# Patient Record
Sex: Female | Born: 1945 | Race: White | Hispanic: No | Marital: Single | Smoking: Never smoker
Health system: Southern US, Community
[De-identification: ages and names within clinical notes are randomized; demographics above are authoritative.]

## PROBLEM LIST (undated history)

## (undated) DIAGNOSIS — E041 Nontoxic single thyroid nodule: Secondary | ICD-10-CM

## (undated) DIAGNOSIS — K5792 Diverticulitis of intestine, part unspecified, without perforation or abscess without bleeding: Secondary | ICD-10-CM

## (undated) DIAGNOSIS — F419 Anxiety disorder, unspecified: Secondary | ICD-10-CM

## (undated) DIAGNOSIS — F32A Depression, unspecified: Secondary | ICD-10-CM

## (undated) DIAGNOSIS — I10 Essential (primary) hypertension: Secondary | ICD-10-CM

## (undated) DIAGNOSIS — E785 Hyperlipidemia, unspecified: Secondary | ICD-10-CM

## (undated) DIAGNOSIS — F329 Major depressive disorder, single episode, unspecified: Secondary | ICD-10-CM

## (undated) HISTORY — DX: Anxiety disorder, unspecified: F41.9

## (undated) HISTORY — DX: Hyperlipidemia, unspecified: E78.5

## (undated) HISTORY — PX: CHOLECYSTECTOMY: SHX55

## (undated) HISTORY — DX: Diverticulitis of intestine, part unspecified, without perforation or abscess without bleeding: K57.92

## (undated) HISTORY — PX: ABDOMINAL HYSTERECTOMY: SHX81

## (undated) HISTORY — PX: OTHER SURGICAL HISTORY: SHX169

## (undated) HISTORY — DX: Nontoxic single thyroid nodule: E04.1

## (undated) HISTORY — DX: Depression, unspecified: F32.A

---
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
## 1993-05-24 HISTORY — PX: BUNIONECTOMY: SHX129

## 1994-05-24 HISTORY — PX: ABDOMINAL HYSTERECTOMY: SHX81

## 1998-03-12 ENCOUNTER — Ambulatory Visit (HOSPITAL_COMMUNITY): Admission: RE | Admit: 1998-03-12 | Discharge: 1998-03-12 | Payer: Self-pay | Admitting: Gastroenterology

## 2000-06-21 ENCOUNTER — Ambulatory Visit (HOSPITAL_COMMUNITY): Admission: RE | Admit: 2000-06-21 | Discharge: 2000-06-21 | Payer: Self-pay | Admitting: Gastroenterology

## 2001-01-17 ENCOUNTER — Encounter: Payer: Self-pay | Admitting: Family Medicine

## 2001-01-17 ENCOUNTER — Encounter: Admission: RE | Admit: 2001-01-17 | Discharge: 2001-01-17 | Payer: Self-pay | Admitting: Family Medicine

## 2001-01-25 ENCOUNTER — Ambulatory Visit (HOSPITAL_COMMUNITY): Admission: RE | Admit: 2001-01-25 | Discharge: 2001-01-25 | Payer: Self-pay | Admitting: Family Medicine

## 2001-01-26 ENCOUNTER — Encounter: Payer: Self-pay | Admitting: Family Medicine

## 2001-02-02 ENCOUNTER — Ambulatory Visit (HOSPITAL_COMMUNITY): Admission: RE | Admit: 2001-02-02 | Discharge: 2001-02-02 | Payer: Self-pay | Admitting: Family Medicine

## 2001-02-02 ENCOUNTER — Encounter: Payer: Self-pay | Admitting: Family Medicine

## 2001-04-19 ENCOUNTER — Encounter: Payer: Self-pay | Admitting: Surgery

## 2001-04-19 ENCOUNTER — Inpatient Hospital Stay (HOSPITAL_COMMUNITY): Admission: EM | Admit: 2001-04-19 | Discharge: 2001-04-20 | Payer: Self-pay | Admitting: Emergency Medicine

## 2002-10-09 ENCOUNTER — Ambulatory Visit (HOSPITAL_COMMUNITY): Admission: RE | Admit: 2002-10-09 | Discharge: 2002-10-09 | Payer: Self-pay | Admitting: Neurology

## 2004-05-11 ENCOUNTER — Encounter: Admission: RE | Admit: 2004-05-11 | Discharge: 2004-05-11 | Payer: Self-pay | Admitting: Family Medicine

## 2005-04-27 ENCOUNTER — Other Ambulatory Visit: Admission: RE | Admit: 2005-04-27 | Discharge: 2005-04-27 | Payer: Self-pay | Admitting: *Deleted

## 2005-05-14 ENCOUNTER — Encounter: Admission: RE | Admit: 2005-05-14 | Discharge: 2005-05-14 | Payer: Self-pay | Admitting: Family Medicine

## 2006-03-10 ENCOUNTER — Encounter: Admission: RE | Admit: 2006-03-10 | Discharge: 2006-04-11 | Payer: Self-pay | Admitting: Family Medicine

## 2006-09-02 ENCOUNTER — Emergency Department (HOSPITAL_COMMUNITY): Admission: EM | Admit: 2006-09-02 | Discharge: 2006-09-02 | Payer: Self-pay | Admitting: Emergency Medicine

## 2010-06-13 ENCOUNTER — Encounter: Payer: Self-pay | Admitting: Family Medicine

## 2010-06-14 ENCOUNTER — Encounter: Payer: Self-pay | Admitting: Family Medicine

## 2010-06-22 ENCOUNTER — Ambulatory Visit (HOSPITAL_COMMUNITY)
Admission: RE | Admit: 2010-06-22 | Discharge: 2010-06-22 | Payer: Self-pay | Source: Home / Self Care | Attending: Family Medicine | Admitting: Family Medicine

## 2013-11-21 HISTORY — PX: CATARACT EXTRACTION: SUR2

## 2014-04-22 ENCOUNTER — Ambulatory Visit
Admission: RE | Admit: 2014-04-22 | Discharge: 2014-04-22 | Disposition: A | Discharge status: Home / Self Care | Payer: Medicare Other | Source: Ambulatory Visit | Attending: Family Medicine | Admitting: Family Medicine

## 2014-04-22 ENCOUNTER — Other Ambulatory Visit: Payer: Self-pay | Admitting: Family Medicine

## 2014-04-22 DIAGNOSIS — M545 Low back pain, unspecified: Secondary | ICD-10-CM

## 2016-08-25 ENCOUNTER — Emergency Department (HOSPITAL_BASED_OUTPATIENT_CLINIC_OR_DEPARTMENT_OTHER): Payer: Medicare Other

## 2016-08-25 ENCOUNTER — Encounter (HOSPITAL_BASED_OUTPATIENT_CLINIC_OR_DEPARTMENT_OTHER): Payer: Self-pay | Admitting: Emergency Medicine

## 2016-08-25 ENCOUNTER — Emergency Department (HOSPITAL_BASED_OUTPATIENT_CLINIC_OR_DEPARTMENT_OTHER)
Admission: EM | Admit: 2016-08-25 | Discharge: 2016-08-25 | Disposition: A | Discharge status: Home / Self Care | Payer: Medicare Other | Attending: Emergency Medicine | Admitting: Emergency Medicine

## 2016-08-25 DIAGNOSIS — R1011 Right upper quadrant pain: Secondary | ICD-10-CM | POA: Diagnosis not present

## 2016-08-25 DIAGNOSIS — Z79899 Other long term (current) drug therapy: Secondary | ICD-10-CM | POA: Insufficient documentation

## 2016-08-25 DIAGNOSIS — I10 Essential (primary) hypertension: Secondary | ICD-10-CM | POA: Insufficient documentation

## 2016-08-25 HISTORY — DX: Essential (primary) hypertension: I10

## 2016-08-25 LAB — URINALYSIS, ROUTINE W REFLEX MICROSCOPIC
Bilirubin Urine: NEGATIVE
Glucose, UA: NEGATIVE mg/dL
HGB URINE DIPSTICK: NEGATIVE
KETONES UR: NEGATIVE mg/dL
Nitrite: NEGATIVE
PROTEIN: NEGATIVE mg/dL
SPECIFIC GRAVITY, URINE: 1.017 (ref 1.005–1.030)
pH: 6.5 (ref 5.0–8.0)

## 2016-08-25 LAB — COMPREHENSIVE METABOLIC PANEL
ALT: 16 U/L (ref 14–54)
AST: 21 U/L (ref 15–41)
Albumin: 4 g/dL (ref 3.5–5.0)
Alkaline Phosphatase: 67 U/L (ref 38–126)
Anion gap: 10 (ref 5–15)
BUN: 15 mg/dL (ref 6–20)
CO2: 26 mmol/L (ref 22–32)
Calcium: 9.1 mg/dL (ref 8.9–10.3)
Chloride: 98 mmol/L — ABNORMAL LOW (ref 101–111)
Creatinine, Ser: 0.69 mg/dL (ref 0.44–1.00)
GFR calc Af Amer: >60 mL/min (ref >60)
GFR calc non Af Amer: >60 mL/min (ref >60)
Glucose, Bld: 116 mg/dL — ABNORMAL HIGH (ref 65–99)
Potassium: 3.4 mmol/L — ABNORMAL LOW (ref 3.5–5.1)
Sodium: 134 mmol/L — ABNORMAL LOW (ref 135–145)
Total Bilirubin: 0.6 mg/dL (ref 0.3–1.2)
Total Protein: 7.1 g/dL (ref 6.5–8.1)

## 2016-08-25 LAB — CBC WITH DIFFERENTIAL/PLATELET
Basophils Absolute: 0 10*3/uL (ref 0.0–0.1)
Basophils Relative: 0 %
Eosinophils Absolute: 0.1 10*3/uL (ref 0.0–0.7)
Eosinophils Relative: 1 %
HCT: 34.6 % — ABNORMAL LOW (ref 36.0–46.0)
Hemoglobin: 12.1 g/dL (ref 12.0–15.0)
Lymphocytes Relative: 20 %
Lymphs Abs: 2.2 10*3/uL (ref 0.7–4.0)
MCH: 29.4 pg (ref 26.0–34.0)
MCHC: 35 g/dL (ref 30.0–36.0)
MCV: 84 fL (ref 78.0–100.0)
Monocytes Absolute: 0.8 10*3/uL (ref 0.1–1.0)
Monocytes Relative: 7 %
Neutro Abs: 7.9 10*3/uL — ABNORMAL HIGH (ref 1.7–7.7)
Neutrophils Relative %: 72 %
Platelets: 246 10*3/uL (ref 150–400)
RBC: 4.12 MIL/uL (ref 3.87–5.11)
RDW: 14.1 % (ref 11.5–15.5)
WBC: 11 10*3/uL — ABNORMAL HIGH (ref 4.0–10.5)

## 2016-08-25 LAB — URINALYSIS, MICROSCOPIC (REFLEX): RBC / HPF: NONE SEEN RBC/hpf (ref 0–5)

## 2016-08-25 LAB — I-STAT CG4 LACTIC ACID, ED: Lactic Acid, Venous: 1.29 mmol/L (ref 0.5–1.9)

## 2016-08-25 LAB — LIPASE, BLOOD: Lipase: 19 U/L (ref 11–51)

## 2016-08-25 MED ORDER — HYDROCODONE-ACETAMINOPHEN 5-325 MG PO TABS: 1.0000 | ORAL_TABLET | Freq: Four times a day (QID) | ORAL | 0 refills | Status: DC | PRN

## 2016-08-25 MED ORDER — SODIUM CHLORIDE 0.9 % IV BOLUS (SEPSIS)
1000.0000 mL | Freq: Once | INTRAVENOUS | Status: AC
  Administered 2016-08-25: 1000 mL via INTRAVENOUS

## 2016-08-25 MED ORDER — IOPAMIDOL (ISOVUE-300) INJECTION 61%
100.0000 mL | Freq: Once | INTRAVENOUS | Status: AC | PRN
  Administered 2016-08-25: 100 mL via INTRAVENOUS

## 2016-08-25 NOTE — ED Provider Notes (Signed)
[facility] Provider Note   CSN: [id number] Arrival date & time: 08/25/16  2025  By signing my name below, I, Audrey Sanchez, attest that this documentation has been prepared under the direction and in the presence of [employer], PA-C.  Electronically Signed: Octavia Sanchez, ED Scribe. 08/25/16. 8:48 PM.    History   Chief Complaint Chief Complaint  Patient presents with  . Abdominal Pain   The history is provided by the patient. No language interpreter was used.   HPI Comments: Audrey Sanchez is a 71 y.o. female who has a PMhx of HTN presents to the Emergency Department complaining of acute onset, right sided abdominal pain since this morning. Pt expresses increased pain when taking a deep breath. She notes feeling very anxious. Pt says she had nausea earlier but it has since resolved. Pt reports having pain with ambulation. She does not have a PMhx of CAD, DVT in legs/lungs or cancer. Pt has an abdominal surgical hx of cesarean section and cholecystectomy. She denies chest pain, fever, chills, and vomiting.  Past Medical History:  Diagnosis Date  . Hypertension     There are no active problems to display for this patient.   Past Surgical History:  Procedure Laterality Date  . ABDOMINAL HYSTERECTOMY    . CHOLECYSTECTOMY      OB History    No data available       Home Medications    Prior to Admission medications   Medication Sig Start Date End Date Taking? Authorizing Provider  valsartan-hydrochlorothiazide (DIOVAN-HCT) 320-25 MG tablet Take 1 tablet by mouth daily.   Yes Historical Provider, MD    Family History No family history on file.  Social History Social History  Substance Use Topics  . Smoking status: Never Smoker  . Smokeless tobacco: Never Used  . Alcohol use No     Allergies   Patient has no known allergies.   Review of Systems Review of Systems  A complete 10 system review of systems was obtained and all systems are  negative except as noted in the HPI and PMH.   Physical Exam Updated Vital Signs BP (!) 161/106 Comment: pt not taken BP meds tonight  Pulse 78   Temp 98.2 F (36.8 C) (Oral)   Resp 18   Ht  (1.549 m)   Wt 153 lb (69.4 kg)   SpO2 96%   BMI 28.91 kg/m   Physical Exam  Constitutional: She is oriented to person, place, and time. She appears well-developed and well-nourished. No distress.  HENT:  Head: Normocephalic and atraumatic.  Mouth/Throat: Oropharynx is clear and moist.  Eyes: EOM are normal. Pupils are equal, round, and reactive to light.  Neck: Normal range of motion. Neck supple.  Cardiovascular: Normal rate, regular rhythm and normal heart sounds.  Exam reveals no gallop and no friction rub.   No murmur heard. Pulmonary/Chest: Effort normal and breath sounds normal. No respiratory distress. She has no wheezes.  Abdominal: Soft. Bowel sounds are normal. She exhibits no distension and no mass. There is tenderness. There is guarding. There is no rebound.  Guarding throughout right side of abdomen  Musculoskeletal: Normal range of motion.  Neurological: She is alert and oriented to person, place, and time. She exhibits normal muscle tone. Coordination normal.  Skin: Skin is warm and dry. Capillary refill takes less than 2 seconds. No rash noted. No erythema.  Psychiatric: She has a normal mood and affect. Her behavior is normal.  Nursing  note and vitals reviewed.    ED Treatments / Results  DIAGNOSTIC STUDIES: Oxygen Saturation is 96% on RA, normal by my interpretation.  COORDINATION OF CARE:  8:46 PM Discussed treatment plan with pt at bedside and pt agreed to plan.  Labs (all labs ordered are listed, but only abnormal results are displayed) Labs Reviewed  URINALYSIS, ROUTINE W REFLEX MICROSCOPIC    EKG  EKG Interpretation None       Radiology No results found.  Procedures Procedures (including critical care time)  Medications Ordered in  ED Medications - No data to display   Initial Impression / Assessment and Plan / ED Course  I have reviewed the triage vital signs and the nursing notes.  Pertinent labs & imaging results that were available during my care of the patient were reviewed by me and considered in my medical decision making (see chart for details).     Dr. Ranae Palms saw the patient as well.  He feels that this is most likely musculoskeletal.  The patient was feeling some better, still having pain.  On reexamination, the patient is advised return here for any worsening in her condition.  CT scan did not show any significant abnormalities at this time.  Patient is advised plan and all questions were answered  Final Clinical Impressions(s) / ED Diagnoses   Final diagnoses:  None   I personally performed the services described in this documentation, which was scribed in my presence. The recorded information has been reviewed and is accurate. New Prescriptions New Prescriptions   No medications on file     Charlestine Night, PA-C 08/27/16 1610    Loren Racer, MD 08/28/16 1725

## 2016-08-25 NOTE — ED Notes (Signed)
Physician at bedside.

## 2016-08-25 NOTE — Discharge Instructions (Signed)
Follow-up with your GI doctor.  Return here as needed for any worsening in your condition

## 2016-08-25 NOTE — ED Triage Notes (Signed)
abd pain since this morning, denies vomiting, slight nausea this morning but went away

## 2017-06-22 DIAGNOSIS — N390 Urinary tract infection, site not specified: Secondary | ICD-10-CM | POA: Diagnosis not present

## 2017-06-22 DIAGNOSIS — R319 Hematuria, unspecified: Secondary | ICD-10-CM | POA: Diagnosis not present

## 2017-09-19 DIAGNOSIS — H5212 Myopia, left eye: Secondary | ICD-10-CM | POA: Diagnosis not present

## 2017-10-14 DIAGNOSIS — R7303 Prediabetes: Secondary | ICD-10-CM | POA: Diagnosis not present

## 2017-10-14 DIAGNOSIS — E7800 Pure hypercholesterolemia, unspecified: Secondary | ICD-10-CM | POA: Diagnosis not present

## 2017-10-14 DIAGNOSIS — R5383 Other fatigue: Secondary | ICD-10-CM | POA: Diagnosis not present

## 2017-10-14 DIAGNOSIS — I10 Essential (primary) hypertension: Secondary | ICD-10-CM | POA: Diagnosis not present

## 2017-10-14 DIAGNOSIS — E041 Nontoxic single thyroid nodule: Secondary | ICD-10-CM | POA: Diagnosis not present

## 2017-10-14 DIAGNOSIS — F339 Major depressive disorder, recurrent, unspecified: Secondary | ICD-10-CM | POA: Diagnosis not present

## 2017-10-14 DIAGNOSIS — F411 Generalized anxiety disorder: Secondary | ICD-10-CM | POA: Diagnosis not present

## 2017-10-19 ENCOUNTER — Other Ambulatory Visit: Payer: Self-pay | Admitting: Family Medicine

## 2017-10-19 DIAGNOSIS — E041 Nontoxic single thyroid nodule: Secondary | ICD-10-CM

## 2017-10-31 DIAGNOSIS — R300 Dysuria: Secondary | ICD-10-CM | POA: Diagnosis not present

## 2017-10-31 DIAGNOSIS — N390 Urinary tract infection, site not specified: Secondary | ICD-10-CM | POA: Diagnosis not present

## 2017-11-02 DIAGNOSIS — H5213 Myopia, bilateral: Secondary | ICD-10-CM | POA: Diagnosis not present

## 2017-11-02 DIAGNOSIS — H524 Presbyopia: Secondary | ICD-10-CM | POA: Diagnosis not present

## 2017-11-02 DIAGNOSIS — H52209 Unspecified astigmatism, unspecified eye: Secondary | ICD-10-CM | POA: Diagnosis not present

## 2017-11-08 DIAGNOSIS — L237 Allergic contact dermatitis due to plants, except food: Secondary | ICD-10-CM | POA: Diagnosis not present

## 2018-03-25 DIAGNOSIS — Z1231 Encounter for screening mammogram for malignant neoplasm of breast: Secondary | ICD-10-CM | POA: Diagnosis not present

## 2018-06-26 DIAGNOSIS — I10 Essential (primary) hypertension: Secondary | ICD-10-CM | POA: Diagnosis not present

## 2018-06-26 DIAGNOSIS — E7800 Pure hypercholesterolemia, unspecified: Secondary | ICD-10-CM | POA: Diagnosis not present

## 2018-06-26 DIAGNOSIS — F339 Major depressive disorder, recurrent, unspecified: Secondary | ICD-10-CM | POA: Diagnosis not present

## 2018-06-26 DIAGNOSIS — G479 Sleep disorder, unspecified: Secondary | ICD-10-CM | POA: Diagnosis not present

## 2018-06-26 DIAGNOSIS — R7303 Prediabetes: Secondary | ICD-10-CM | POA: Diagnosis not present

## 2018-06-26 DIAGNOSIS — R7309 Other abnormal glucose: Secondary | ICD-10-CM | POA: Diagnosis not present

## 2018-06-26 DIAGNOSIS — Z8639 Personal history of other endocrine, nutritional and metabolic disease: Secondary | ICD-10-CM | POA: Diagnosis not present

## 2018-06-26 DIAGNOSIS — F411 Generalized anxiety disorder: Secondary | ICD-10-CM | POA: Diagnosis not present

## 2018-07-05 DIAGNOSIS — J209 Acute bronchitis, unspecified: Secondary | ICD-10-CM | POA: Diagnosis not present

## 2018-07-24 DIAGNOSIS — K111 Hypertrophy of salivary gland: Secondary | ICD-10-CM | POA: Diagnosis not present

## 2018-07-26 DIAGNOSIS — K115 Sialolithiasis: Secondary | ICD-10-CM | POA: Diagnosis not present

## 2018-11-07 DIAGNOSIS — H903 Sensorineural hearing loss, bilateral: Secondary | ICD-10-CM | POA: Diagnosis not present

## 2018-12-11 DIAGNOSIS — H524 Presbyopia: Secondary | ICD-10-CM | POA: Diagnosis not present

## 2019-02-16 DIAGNOSIS — Z20828 Contact with and (suspected) exposure to other viral communicable diseases: Secondary | ICD-10-CM | POA: Diagnosis not present

## 2019-03-05 DIAGNOSIS — Z8639 Personal history of other endocrine, nutritional and metabolic disease: Secondary | ICD-10-CM | POA: Diagnosis not present

## 2019-03-05 DIAGNOSIS — R7303 Prediabetes: Secondary | ICD-10-CM | POA: Diagnosis not present

## 2019-03-05 DIAGNOSIS — G479 Sleep disorder, unspecified: Secondary | ICD-10-CM | POA: Diagnosis not present

## 2019-03-05 DIAGNOSIS — F411 Generalized anxiety disorder: Secondary | ICD-10-CM | POA: Diagnosis not present

## 2019-03-05 DIAGNOSIS — F339 Major depressive disorder, recurrent, unspecified: Secondary | ICD-10-CM | POA: Diagnosis not present

## 2019-03-05 DIAGNOSIS — I10 Essential (primary) hypertension: Secondary | ICD-10-CM | POA: Diagnosis not present

## 2019-03-05 DIAGNOSIS — E7800 Pure hypercholesterolemia, unspecified: Secondary | ICD-10-CM | POA: Diagnosis not present

## 2019-03-23 DIAGNOSIS — Z0000 Encounter for general adult medical examination without abnormal findings: Secondary | ICD-10-CM | POA: Diagnosis not present

## 2019-03-23 DIAGNOSIS — F411 Generalized anxiety disorder: Secondary | ICD-10-CM | POA: Diagnosis not present

## 2019-03-23 DIAGNOSIS — R7303 Prediabetes: Secondary | ICD-10-CM | POA: Diagnosis not present

## 2019-03-23 DIAGNOSIS — I10 Essential (primary) hypertension: Secondary | ICD-10-CM | POA: Diagnosis not present

## 2019-03-23 DIAGNOSIS — E7800 Pure hypercholesterolemia, unspecified: Secondary | ICD-10-CM | POA: Diagnosis not present

## 2019-03-23 DIAGNOSIS — G479 Sleep disorder, unspecified: Secondary | ICD-10-CM | POA: Diagnosis not present

## 2019-03-23 DIAGNOSIS — F339 Major depressive disorder, recurrent, unspecified: Secondary | ICD-10-CM | POA: Diagnosis not present

## 2019-04-17 DIAGNOSIS — Z1231 Encounter for screening mammogram for malignant neoplasm of breast: Secondary | ICD-10-CM | POA: Diagnosis not present

## 2019-04-25 ENCOUNTER — Encounter (HOSPITAL_BASED_OUTPATIENT_CLINIC_OR_DEPARTMENT_OTHER): Payer: Self-pay

## 2019-04-25 ENCOUNTER — Emergency Department (HOSPITAL_BASED_OUTPATIENT_CLINIC_OR_DEPARTMENT_OTHER): Payer: Medicare HMO

## 2019-04-25 ENCOUNTER — Other Ambulatory Visit (HOSPITAL_BASED_OUTPATIENT_CLINIC_OR_DEPARTMENT_OTHER): Payer: Self-pay | Admitting: Family Medicine

## 2019-04-25 ENCOUNTER — Ambulatory Visit (HOSPITAL_BASED_OUTPATIENT_CLINIC_OR_DEPARTMENT_OTHER): Admission: RE | Admit: 2019-04-25 | Payer: Medicare HMO | Source: Ambulatory Visit

## 2019-04-25 ENCOUNTER — Other Ambulatory Visit: Payer: Self-pay

## 2019-04-25 ENCOUNTER — Emergency Department (HOSPITAL_BASED_OUTPATIENT_CLINIC_OR_DEPARTMENT_OTHER)
Admission: EM | Admit: 2019-04-25 | Discharge: 2019-04-25 | Disposition: A | Discharge status: Home / Self Care | Payer: Medicare HMO | Attending: Emergency Medicine | Admitting: Emergency Medicine

## 2019-04-25 DIAGNOSIS — M79605 Pain in left leg: Secondary | ICD-10-CM | POA: Diagnosis not present

## 2019-04-25 DIAGNOSIS — Z79899 Other long term (current) drug therapy: Secondary | ICD-10-CM | POA: Diagnosis not present

## 2019-04-25 DIAGNOSIS — M79652 Pain in left thigh: Secondary | ICD-10-CM | POA: Diagnosis not present

## 2019-04-25 DIAGNOSIS — M79662 Pain in left lower leg: Secondary | ICD-10-CM | POA: Diagnosis not present

## 2019-04-25 DIAGNOSIS — I10 Essential (primary) hypertension: Secondary | ICD-10-CM | POA: Insufficient documentation

## 2019-04-25 LAB — CBC WITH DIFFERENTIAL/PLATELET
Abs Immature Granulocytes: 0.03 10*3/uL (ref 0.00–0.07)
Basophils Absolute: 0 10*3/uL (ref 0.0–0.1)
Basophils Relative: 0 %
Eosinophils Absolute: 0 10*3/uL (ref 0.0–0.5)
Eosinophils Relative: 0 %
HCT: 38.2 % (ref 36.0–46.0)
Hemoglobin: 13.1 g/dL (ref 12.0–15.0)
Immature Granulocytes: 0 %
Lymphocytes Relative: 11 %
Lymphs Abs: 1.1 10*3/uL (ref 0.7–4.0)
MCH: 29 pg (ref 26.0–34.0)
MCHC: 34.3 g/dL (ref 30.0–36.0)
MCV: 84.5 fL (ref 80.0–100.0)
Monocytes Absolute: 0.3 10*3/uL (ref 0.1–1.0)
Monocytes Relative: 3 %
Neutro Abs: 8.7 10*3/uL — ABNORMAL HIGH (ref 1.7–7.7)
Neutrophils Relative %: 86 %
Platelets: 284 10*3/uL (ref 150–400)
RBC: 4.52 MIL/uL (ref 3.87–5.11)
RDW: 13.5 % (ref 11.5–15.5)
WBC: 10.2 10*3/uL (ref 4.0–10.5)
nRBC: 0 % (ref 0.0–0.2)

## 2019-04-25 LAB — COMPREHENSIVE METABOLIC PANEL WITH GFR
ALT: 27 U/L (ref 0–44)
AST: 36 U/L (ref 15–41)
Albumin: 4.3 g/dL (ref 3.5–5.0)
Alkaline Phosphatase: 74 U/L (ref 38–126)
Anion gap: 12 (ref 5–15)
BUN: 14 mg/dL (ref 8–23)
CO2: 23 mmol/L (ref 22–32)
Calcium: 9.4 mg/dL (ref 8.9–10.3)
Chloride: 95 mmol/L — ABNORMAL LOW (ref 98–111)
Creatinine, Ser: 0.61 mg/dL (ref 0.44–1.00)
GFR calc Af Amer: >60 mL/min
GFR calc non Af Amer: >60 mL/min
Glucose, Bld: 124 mg/dL — ABNORMAL HIGH (ref 70–99)
Potassium: 3.2 mmol/L — ABNORMAL LOW (ref 3.5–5.1)
Sodium: 130 mmol/L — ABNORMAL LOW (ref 135–145)
Total Bilirubin: 0.9 mg/dL (ref 0.3–1.2)
Total Protein: 8 g/dL (ref 6.5–8.1)

## 2019-04-25 MED ORDER — HYDROCODONE-ACETAMINOPHEN 5-325 MG PO TABS
1.0000 | ORAL_TABLET | Freq: Once | ORAL | Status: AC
  Administered 2019-04-25: 16:00:00 1 via ORAL
  Filled 2019-04-25: qty 1

## 2019-04-25 NOTE — ED Notes (Signed)
Pt on monitor and is aware of [place] needing a urine sample, RN [facility] informed.

## 2019-04-25 NOTE — ED Triage Notes (Addendum)
Pt c/o left LE pain x days-states she was sent from PCP for a 215pm study to r/o blood clot-states she was given pain inj in the ofice that did not help pain-states she wants to be seen in ED for elevated BP and pain-pt anxious/hyperventilating-encouraged to take slow deep breaths-to triage and tx area in w/c

## 2019-04-25 NOTE — ED Notes (Signed)
EDP at bedside discussing test results and dispo plan of care. 

## 2019-04-25 NOTE — ED Provider Notes (Signed)
[facility] EMERGENCY DEPARTMENT Provider Note   CSN: [id number] Arrival date & time: 04/25/19  1321     History   Chief Complaint Chief Complaint  Patient presents with   Leg Pain    HPI Audrey Sanchez is a 73 y.o. female with a past medical history significant for hypertension who presents to the ED from her PCP office for an evaluation of left leg pain.  Patient states left leg pain started gradually on Sunday and has progressively gotten worse. Pain started in her left groin area and radiates down anterior aspect of leg to the left knee and sometimes into the buttock region.  Patient denies injury. Patient describes it as a constant aching feeling.  Patient denies associative edema and erythema.  Patient has tried Tylenol without relief.  Patient denies history of blood clots, recent surgeries, recent immobilizations, history of cancer, and hemoptysis.  Patient denies shortness of breath and chest pain.   Past Medical History:  Diagnosis Date   Hypertension     There are no active problems to display for this patient.   Past Surgical History:  Procedure Laterality Date   ABDOMINAL HYSTERECTOMY     CHOLECYSTECTOMY       OB History   No obstetric history on file.      Home Medications    Prior to Admission medications   Medication Sig Start Date End Date Taking? Authorizing Provider  HYDROcodone-acetaminophen (NORCO/VICODIN) 5-325 MG tablet Take 1 tablet by mouth every 6 (six) hours as needed for moderate pain. 08/25/16   Lawyer, Cristal Deer, PA-C  valsartan-hydrochlorothiazide (DIOVAN-HCT) 320-25 MG tablet Take 1 tablet by mouth daily.    [provider]    Family History No family history on file.  Social History Social History   Tobacco Use   Smoking status: Never Smoker   Smokeless tobacco: Never Used  Substance Use Topics   Alcohol use: No   Drug use: No     Allergies   Patient has no known allergies.   Review of  Systems Review of Systems  Constitutional: Negative for chills and fever.  Respiratory: Negative for cough and shortness of breath.   Cardiovascular: Negative for chest pain.  Musculoskeletal: Positive for myalgias. Negative for back pain.  Skin: Negative for color change.  Neurological: Negative for numbness.  All other systems reviewed and are negative.    Physical Exam Updated Vital Signs BP (!) 202/118 (BP Location: Right Arm)    Pulse 91    Temp 98.6 F (37 C) (Oral)    Resp 16    Ht 5' (1.524 m)    Wt 68.9 kg    SpO2 100%    BMI 29.69 kg/m   Physical Exam Vitals signs and nursing note reviewed.  Constitutional:      General: She is not in acute distress.    Appearance: She is not ill-appearing.  HENT:     Head: Normocephalic.  Eyes:     Conjunctiva/sclera: Conjunctivae normal.  Neck:     Musculoskeletal: Neck supple.  Cardiovascular:     Rate and Rhythm: Normal rate and regular rhythm.     Pulses: Normal pulses.     Heart sounds: Normal heart sounds. No murmur. No friction rub. No gallop.   Pulmonary:     Effort: Pulmonary effort is normal.     Breath sounds: Normal breath sounds.     Comments: Respirations equal and unlabored, patient able to speak in full sentences, lungs clear to  auscultation bilaterally Abdominal:     General: Abdomen is flat. There is no distension.     Palpations: Abdomen is soft.     Tenderness: There is no abdominal tenderness. There is no guarding or rebound.  Musculoskeletal:     Comments: Tenderness to palpation over anterior aspect of left thigh. No edema or erythema. No deformity or crepitus. No calf tenderness. Negative Homans sign. All distal pulses and sensation intact.  Skin:    General: Skin is warm and dry.  Neurological:     General: No focal deficit present.     Mental Status: She is alert.      ED Treatments / Results  Labs (all labs ordered are listed, but only abnormal results are displayed) Labs Reviewed  CBC WITH  DIFFERENTIAL/PLATELET - Abnormal; Notable for the following components:      Result Value   Neutro Abs 8.7 (*)    All other components within normal limits  COMPREHENSIVE METABOLIC PANEL - Abnormal; Notable for the following components:   Sodium 130 (*)    Potassium 3.2 (*)    Chloride 95 (*)    Glucose, Bld 124 (*)    All other components within normal limits    EKG EKG Interpretation  Date/Time:  Wednesday April 25 2019 14:37:35 EST Ventricular Rate:  92 PR Interval:    QRS Duration: 72 QT Interval:  380 QTC Calculation: 471 R Axis:   67 Text Interpretation: Sinus rhythm Borderline T abnormalities, anterior leads Baseline wander in lead(s) V2 V3 Confirmed by Virgina NorfolkAdam, [place] [phone]([id number]) on 04/25/2019 3:31:35 PM   Radiology [place] Venous Img Lower Unilateral Left  Result Date: 04/25/2019 CLINICAL DATA:  Left lower extremity pain for the past 3-4 days. Evaluate for DVT. EXAM: LEFT LOWER EXTREMITY VENOUS DOPPLER ULTRASOUND TECHNIQUE: Gray-scale sonography with graded compression, as well as color Doppler and duplex ultrasound were performed to evaluate the lower extremity deep venous systems from the level of the common femoral vein and including the common femoral, femoral, profunda femoral, popliteal and calf veins including the posterior tibial, peroneal and gastrocnemius veins when visible. The superficial great saphenous vein was also interrogated. Spectral Doppler was utilized to evaluate flow at rest and with distal augmentation maneuvers in the common femoral, femoral and popliteal veins. COMPARISON:  None. FINDINGS: Contralateral Common Femoral Vein: Respiratory phasicity is normal and symmetric with the symptomatic side. No evidence of thrombus. Normal compressibility. Common Femoral Vein: No evidence of thrombus. Normal compressibility, respiratory phasicity and response to augmentation. Saphenofemoral Junction: No evidence of thrombus. Normal compressibility and flow on color Doppler  imaging. Profunda Femoral Vein: No evidence of thrombus. Normal compressibility and flow on color Doppler imaging. Femoral Vein: No evidence of thrombus. Normal compressibility, respiratory phasicity and response to augmentation. Popliteal Vein: No evidence of thrombus. Normal compressibility, respiratory phasicity and response to augmentation. Calf Veins: No evidence of thrombus. Normal compressibility and flow on color Doppler imaging. Superficial Great Saphenous Vein: No evidence of thrombus. Normal compressibility. Venous Reflux:  None. Other Findings: Note is made of a prominent though non pathologically enlarged left inguinal lymph node which is not enlarged by size criteria measuring 0.7 cm in greatest short axis diameter and maintains a benign fatty hilum, presumably reactive in etiology. IMPRESSION: No evidence of DVT within the left lower extremity. Electronically Signed   By: Simonne ComeJohn  Watts M.D.   On: 04/25/2019 15:59    Procedures Procedures (including critical care time)  Medications Ordered in ED Medications  HYDROcodone-acetaminophen (NORCO/VICODIN) 5-325 MG  per tablet 1 tablet (1 tablet Oral Given 04/25/19 1559)     Initial Impression / Assessment and Plan / ED Course  I have reviewed the triage vital signs and the nursing notes.  Pertinent labs & imaging results that were available during my care of the patient were reviewed by me and considered in my medical decision making (see chart for details).       73 year old female presents to the ED due to severe left leg pain. Patient was sent from her PCP to rule out a DVT. Vitals stable, with elevated blood pressure at 202/118. Triage note states patient was anxious and hyperventilating during initial vitals which likely caused the elevation in addition to her pain. Will continue to monitor. Patient in no acute distress and non-toxic appearing. Tenderness to palpation over anterior and lateral aspect of left upper leg. No erythema,  edema, or warmth. Negative Homans sign. Neurovascularly intact. Patient has no risk factors for DVT/PE, but will order [place] to rule out DVT given PCP concern. Given patient's increased BP will order routine labs to check for organ damage to rule out hypertensive emergency.   CMP reassuring with normal renal function. CBC reassuring with no leukocytosis. Labs negative for organ damage. Doubt hypertensive emergency. Patient is also asymptomatic. EKG personally reviewed which demonstrates sinus rhythm with no signs of ischemia. [place] personally reviewed which is negative for DVT, but did display:  prominent though non  pathologically enlarged left inguinal lymph node which is not  enlarged by size criteria measuring 0.7 cm in greatest short axis  diameter and maintains a benign fatty hilum, presumably reactive in  etiology.   Patient unable to give urine sample, but given her normal labs, doubt hypertensive emergency. Patient advised to use warm compresses to left groin area. Patient has been instructed to follow-up with PCP within the next week to follow-up on lymph node and elevated blood pressure. Strict ED precautions discussed with patient. Patient states understanding and agrees to plan. Patient discharged home in no acute distress and stable vitals  Final Clinical Impressions(s) / ED Diagnoses   Final diagnoses:  Left leg pain    ED Discharge Orders    None       Romie Levee 04/25/19 2126    Lennice Sites, DO 04/26/19 [phone]

## 2019-04-25 NOTE — ED Notes (Signed)
EDP made aware that patient c/o pain to left lateral upper thigh.  Noted that she has 4 salonpas to site and stated that it does not help the pain.

## 2019-04-25 NOTE — Discharge Instructions (Addendum)
As discussed, your ultrasound was negative for a blood clot, but showed an enlarged left inguinal lymph node. I recommend using warm compresses in that area frequently. Follow-up with your PCP within the next week to recheck lymph node and to check blood pressure. I would assume your blood pressure is elevated due to severe pain. You may take over the counter ibuprofen or tylenol as needed for pain. Return to the ER for new or worsening symptoms.

## 2019-05-02 DIAGNOSIS — M25552 Pain in left hip: Secondary | ICD-10-CM | POA: Diagnosis not present

## 2019-05-03 DIAGNOSIS — M25552 Pain in left hip: Secondary | ICD-10-CM | POA: Diagnosis not present

## 2019-05-03 DIAGNOSIS — M79605 Pain in left leg: Secondary | ICD-10-CM | POA: Diagnosis not present

## 2019-05-04 ENCOUNTER — Emergency Department (HOSPITAL_COMMUNITY)
Admission: EM | Admit: 2019-05-04 | Discharge: 2019-05-05 | Disposition: A | Discharge status: Home / Self Care | Payer: Medicare HMO | Attending: Emergency Medicine | Admitting: Emergency Medicine

## 2019-05-04 ENCOUNTER — Encounter (HOSPITAL_COMMUNITY): Payer: Self-pay | Admitting: Emergency Medicine

## 2019-05-04 ENCOUNTER — Emergency Department (HOSPITAL_COMMUNITY): Payer: Medicare HMO

## 2019-05-04 ENCOUNTER — Other Ambulatory Visit: Payer: Self-pay

## 2019-05-04 DIAGNOSIS — M79606 Pain in leg, unspecified: Secondary | ICD-10-CM | POA: Diagnosis not present

## 2019-05-04 DIAGNOSIS — M79605 Pain in left leg: Secondary | ICD-10-CM

## 2019-05-04 DIAGNOSIS — I10 Essential (primary) hypertension: Secondary | ICD-10-CM | POA: Insufficient documentation

## 2019-05-04 DIAGNOSIS — M25552 Pain in left hip: Secondary | ICD-10-CM | POA: Diagnosis not present

## 2019-05-04 DIAGNOSIS — M79662 Pain in left lower leg: Secondary | ICD-10-CM | POA: Insufficient documentation

## 2019-05-04 DIAGNOSIS — M79652 Pain in left thigh: Secondary | ICD-10-CM | POA: Insufficient documentation

## 2019-05-04 DIAGNOSIS — R52 Pain, unspecified: Secondary | ICD-10-CM

## 2019-05-04 LAB — CBC WITH DIFFERENTIAL/PLATELET
Abs Immature Granulocytes: 0.03 10*3/uL (ref 0.00–0.07)
Basophils Absolute: 0 10*3/uL (ref 0.0–0.1)
Basophils Relative: 1 %
Eosinophils Absolute: 0.1 10*3/uL (ref 0.0–0.5)
Eosinophils Relative: 2 %
HCT: 37.5 % (ref 36.0–46.0)
Hemoglobin: 12.8 g/dL (ref 12.0–15.0)
Immature Granulocytes: 0 %
Lymphocytes Relative: 36 %
Lymphs Abs: 3.1 10*3/uL (ref 0.7–4.0)
MCH: 29.1 pg (ref 26.0–34.0)
MCHC: 34.1 g/dL (ref 30.0–36.0)
MCV: 85.2 fL (ref 80.0–100.0)
Monocytes Absolute: 0.7 10*3/uL (ref 0.1–1.0)
Monocytes Relative: 8 %
Neutro Abs: 4.7 10*3/uL (ref 1.7–7.7)
Neutrophils Relative %: 53 %
Platelets: 308 10*3/uL (ref 150–400)
RBC: 4.4 MIL/uL (ref 3.87–5.11)
RDW: 13.8 % (ref 11.5–15.5)
WBC: 8.7 10*3/uL (ref 4.0–10.5)
nRBC: 0 % (ref 0.0–0.2)

## 2019-05-04 LAB — BASIC METABOLIC PANEL
Anion gap: 11 (ref 5–15)
BUN: 17 mg/dL (ref 8–23)
CO2: 25 mmol/L (ref 22–32)
Calcium: 9.5 mg/dL (ref 8.9–10.3)
Chloride: 94 mmol/L — ABNORMAL LOW (ref 98–111)
Creatinine, Ser: 0.79 mg/dL (ref 0.44–1.00)
GFR calc Af Amer: >60 mL/min (ref >60)
GFR calc non Af Amer: >60 mL/min (ref >60)
Glucose, Bld: 100 mg/dL — ABNORMAL HIGH (ref 70–99)
Potassium: 3.8 mmol/L (ref 3.5–5.1)
Sodium: 130 mmol/L — ABNORMAL LOW (ref 135–145)

## 2019-05-04 LAB — MAGNESIUM: Magnesium: 1.9 mg/dL (ref 1.7–2.4)

## 2019-05-04 LAB — CK: Total CK: 51 U/L (ref 38–234)

## 2019-05-04 MED ORDER — ONDANSETRON 4 MG PO TBDP
4.0000 mg | ORAL_TABLET | Freq: Once | ORAL | Status: AC
  Administered 2019-05-04: 4 mg via ORAL
  Filled 2019-05-04: qty 1

## 2019-05-04 MED ORDER — CYCLOBENZAPRINE HCL 5 MG PO TABS: 5.0000 mg | ORAL_TABLET | Freq: Three times a day (TID) | ORAL | 0 refills | Status: DC | PRN

## 2019-05-04 MED ORDER — NAPROXEN 500 MG PO TABS: 500.0000 mg | ORAL_TABLET | Freq: Two times a day (BID) | ORAL | 0 refills | Status: DC | PRN

## 2019-05-04 MED ORDER — HYDROMORPHONE HCL 1 MG/ML IJ SOLN
1.0000 mg | Freq: Once | INTRAMUSCULAR | Status: AC
Start: 2019-05-04 — End: 2019-05-04
  Administered 2019-05-04: 1 mg via INTRAVENOUS
  Filled 2019-05-04: qty 1

## 2019-05-04 NOTE — ED Triage Notes (Signed)
Pt reports 1 week and 3 days ago was sent to med center to r/o blood clot due to pain leg pain that starts in left groin and radiates into thigh. Per pt she has also since ortho MD had Xrays and also an MRI of her hip and nothing has shown. Pt is currently taken oxycodone and does not seem to help this pain. Pt is still able to walk on this leg but is painful.

## 2019-05-04 NOTE — ED Provider Notes (Signed)
[facility] EMERGENCY DEPARTMENT Provider Note   CSN: [id number] Arrival date & time: 05/04/19  1616     History Chief Complaint  Patient presents with  . Leg Pain    Audrey Sanchez is a 73 y.o. female.  Please management with 2 weeks of left leg pain.  Patient states pain extends from left hip, left thigh, left knee.  Denies any recent injuries.  Pain has been relatively constant, has been taking Norco with some relief.  Also took some oxycodone with some relief.  States pain 10 out of 10, sharp, stabbing.  Worse in certain positions.  No back pain, no abdominal pain.  No burning with urination, no fevers, no skin changes, no numbness or weakness, no bladder or bowel incontinence.  HPI     Past Medical History:  Diagnosis Date  . Hypertension     There are no problems to display for this patient.   Past Surgical History:  Procedure Laterality Date  . ABDOMINAL HYSTERECTOMY    . CHOLECYSTECTOMY       OB History   No obstetric history on file.     No family history on file.  Social History   Tobacco Use  . Smoking status: Never Smoker  . Smokeless tobacco: Never Used  Substance Use Topics  . Alcohol use: No  . Drug use: No    Home Medications Prior to Admission medications   Medication Sig Start Date End Date Taking? Authorizing Provider  cyclobenzaprine (FLEXERIL) 5 MG tablet Take 1 tablet (5 mg total) by mouth 3 (three) times daily as needed for muscle spasms. 05/04/19   Milagros Loll, MD  HYDROcodone-acetaminophen (NORCO/VICODIN) 5-325 MG tablet Take 1 tablet by mouth every 6 (six) hours as needed for moderate pain. 08/25/16   Lawyer, Cristal Deer, PA-C  naproxen (NAPROSYN) 500 MG tablet Take 1 tablet (500 mg total) by mouth 2 (two) times daily as needed for moderate pain. 05/04/19   Milagros Loll, MD  valsartan-hydrochlorothiazide (DIOVAN-HCT) 320-25 MG tablet Take 1 tablet by mouth daily.    [provider]     Allergies    Patient has no known allergies.  Review of Systems   Review of Systems  Constitutional: Negative for chills and fever.  HENT: Negative for ear pain and sore throat.   Eyes: Negative for pain and visual disturbance.  Respiratory: Negative for cough and shortness of breath.   Cardiovascular: Negative for chest pain and palpitations.  Gastrointestinal: Negative for abdominal pain and vomiting.  Genitourinary: Negative for dysuria and hematuria.  Musculoskeletal: Positive for arthralgias. Negative for back pain.  Skin: Negative for color change and rash.  Neurological: Negative for seizures and syncope.  All other systems reviewed and are negative.   Physical Exam Updated Vital Signs BP 113/70   Pulse 80   Temp 98.3 F (36.8 C) (Oral)   Resp 17   SpO2 98%   Physical Exam Vitals and nursing note reviewed.  Constitutional:      General: She is not in acute distress.    Appearance: She is well-developed.  HENT:     Head: Normocephalic and atraumatic.  Eyes:     Conjunctiva/sclera: Conjunctivae normal.  Cardiovascular:     Rate and Rhythm: Normal rate and regular rhythm.     Heart sounds: No murmur.  Pulmonary:     Effort: Pulmonary effort is normal. No respiratory distress.     Breath sounds: Normal breath sounds.  Abdominal:  Palpations: Abdomen is soft.     Tenderness: There is no abdominal tenderness.  Musculoskeletal:     Cervical back: Neck supple.     Comments: Generalized TTP from hip to proximal knee, no obvious deformity, normal joint ROM  Skin:    General: Skin is warm and dry.  Neurological:     General: No focal deficit present.     Mental Status: She is alert and oriented to person, place, and time.     Comments: 5/5 strength in b/l LE, sensation to light touch intact in b/l LE     ED Results / Procedures / Treatments   Labs (all labs ordered are listed, but only abnormal results are displayed) Labs Reviewed  BASIC METABOLIC PANEL -  Abnormal; Notable for the following components:      Result Value   Sodium 130 (*)    Chloride 94 (*)    Glucose, Bld 100 (*)    All other components within normal limits  CBC WITH DIFFERENTIAL/PLATELET  MAGNESIUM  CK  URINALYSIS, ROUTINE W REFLEX MICROSCOPIC    EKG None  Radiology DG FEMUR MIN 2 VIEWS LEFT  Result Date: 05/04/2019 CLINICAL DATA:  Left leg pain, no known injury, initial encounter EXAM: LEFT FEMUR 2 VIEWS COMPARISON:  None. FINDINGS: Degenerative changes about the hip joint and knee joint are seen. No acute fracture or dislocation is noted. No soft tissue abnormality is seen. IMPRESSION: No acute abnormality noted. Electronically Signed   By: Alcide CleverMark  Lukens M.D.   On: 05/04/2019 23:09    Procedures Procedures (including critical care time)  Medications Ordered in ED Medications  HYDROmorphone (DILAUDID) injection 1 mg (1 mg Intravenous Given 05/04/19 2203)    ED Course  I have reviewed the triage vital signs and the nursing notes.  Pertinent labs & imaging results that were available during my care of the patient were reviewed by me and considered in my medical decision making (see chart for details).  Clinical Course as of May 04 2331  Fri May 04, 2019  2329 Recheck patient, updated on results, pain completely resolved after single dose of Dilaudid, reviewed return precautions, will discharge home   [RD]    Clinical Course User Index [RD] Milagros Lollykstra, Deforrest Bogle S, MD   MDM Rules/Calculators/A&P                    73 year old lady presents to ER with 2 weeks of left leg pain.  On exam patient noted to be well-appearing, normal neurologic exam, no obvious deformity.  She reports prior work-up with her orthopod included x-rays of femur, hip which were negative.  Also states she had an MRI of her hip which was negative.  Reports her orthopod planning to MRI femur and back.  Lower extremity duplex of left leg was negative.  Today, check CK to eval for rhabdo,  myositis.  This was within normal limits, given this finding no physical exam abnormality, very low suspicion for this diagnosis.  Given my exam, the extensive work-up that has been done previously, do not feel any additional work-up needed at this time.  Pain well controlled with single dose of IV narcotics.  She has been previously prescribed multiple narcotic prescriptions, recommended trial of NSAIDs and muscle relaxer.  Recommended close recheck with both her primary and orthopedic doctor.  Reviewed return precautions, will discharge home.    After the discussed management above, the patient was determined to be safe for discharge.  The patient was  in agreement with this plan and all questions regarding their care were answered.  ED return precautions were discussed and the patient will return to the ED with any significant worsening of condition.   Final Clinical Impression(s) / ED Diagnoses Final diagnoses:  Pain  Pain of left lower extremity    Rx / DC Orders ED Discharge Orders         Ordered    cyclobenzaprine (FLEXERIL) 5 MG tablet  3 times daily PRN     05/04/19 2328    naproxen (NAPROSYN) 500 MG tablet  2 times daily PRN     05/04/19 2328           Lucrezia Starch, MD 05/04/19 2334

## 2019-05-04 NOTE — Discharge Instructions (Addendum)
I recommend following up with your primary doctor as well as with your orthopedic doctor.  Recommend keeping your previously scheduled outpatient MRIs.  If you develop numbness, weakness, bladder or bowel incontinence, redness, swelling, fevers or other new concerning symptom, recommend return to ER for recheck.  Recommend taking prescribed muscle relaxer and anti-inflammatory for your pain.  As needed for breakthrough pain can take the previously prescribed narcotic.  Please note that the muscle relaxer and your narcotics can make you drowsy and should be taken while driving or operating heavy machinery.

## 2019-05-07 ENCOUNTER — Emergency Department (HOSPITAL_COMMUNITY): Payer: Medicare HMO

## 2019-05-07 ENCOUNTER — Emergency Department (HOSPITAL_COMMUNITY)
Admission: EM | Admit: 2019-05-07 | Discharge: 2019-05-07 | Disposition: A | Discharge status: Home / Self Care | Payer: Medicare HMO | Attending: Emergency Medicine | Admitting: Emergency Medicine

## 2019-05-07 ENCOUNTER — Encounter (HOSPITAL_COMMUNITY): Payer: Self-pay

## 2019-05-07 ENCOUNTER — Other Ambulatory Visit: Payer: Self-pay

## 2019-05-07 DIAGNOSIS — R109 Unspecified abdominal pain: Secondary | ICD-10-CM | POA: Diagnosis not present

## 2019-05-07 DIAGNOSIS — R1031 Right lower quadrant pain: Secondary | ICD-10-CM | POA: Diagnosis not present

## 2019-05-07 DIAGNOSIS — Z79899 Other long term (current) drug therapy: Secondary | ICD-10-CM | POA: Insufficient documentation

## 2019-05-07 DIAGNOSIS — R52 Pain, unspecified: Secondary | ICD-10-CM | POA: Diagnosis not present

## 2019-05-07 DIAGNOSIS — R1032 Left lower quadrant pain: Secondary | ICD-10-CM | POA: Diagnosis not present

## 2019-05-07 DIAGNOSIS — K5900 Constipation, unspecified: Secondary | ICD-10-CM | POA: Diagnosis not present

## 2019-05-07 DIAGNOSIS — L299 Pruritus, unspecified: Secondary | ICD-10-CM | POA: Diagnosis not present

## 2019-05-07 DIAGNOSIS — M79605 Pain in left leg: Secondary | ICD-10-CM | POA: Diagnosis present

## 2019-05-07 DIAGNOSIS — I10 Essential (primary) hypertension: Secondary | ICD-10-CM | POA: Diagnosis not present

## 2019-05-07 DIAGNOSIS — M79652 Pain in left thigh: Secondary | ICD-10-CM | POA: Insufficient documentation

## 2019-05-07 DIAGNOSIS — R064 Hyperventilation: Secondary | ICD-10-CM | POA: Diagnosis not present

## 2019-05-07 LAB — COMPREHENSIVE METABOLIC PANEL
ALT: 23 U/L (ref 0–44)
AST: 37 U/L (ref 15–41)
Albumin: 4.1 g/dL (ref 3.5–5.0)
Alkaline Phosphatase: 69 U/L (ref 38–126)
Anion gap: 13 (ref 5–15)
BUN: 11 mg/dL (ref 8–23)
CO2: 23 mmol/L (ref 22–32)
Calcium: 9.6 mg/dL (ref 8.9–10.3)
Chloride: 95 mmol/L — ABNORMAL LOW (ref 98–111)
Creatinine, Ser: 0.72 mg/dL (ref 0.44–1.00)
GFR calc Af Amer: >60 mL/min (ref >60)
GFR calc non Af Amer: >60 mL/min (ref >60)
Glucose, Bld: 92 mg/dL (ref 70–99)
Potassium: 4.7 mmol/L (ref 3.5–5.1)
Sodium: 131 mmol/L — ABNORMAL LOW (ref 135–145)
Total Bilirubin: 0.9 mg/dL (ref 0.3–1.2)
Total Protein: 7.2 g/dL (ref 6.5–8.1)

## 2019-05-07 LAB — URINALYSIS, ROUTINE W REFLEX MICROSCOPIC
Bilirubin Urine: NEGATIVE
Glucose, UA: NEGATIVE mg/dL
Hgb urine dipstick: NEGATIVE
Ketones, ur: NEGATIVE mg/dL
Leukocytes,Ua: NEGATIVE
Nitrite: NEGATIVE
Protein, ur: NEGATIVE mg/dL
Specific Gravity, Urine: 1.01 (ref 1.005–1.030)
pH: 9 — ABNORMAL HIGH (ref 5.0–8.0)

## 2019-05-07 LAB — CBC
HCT: 38.5 % (ref 36.0–46.0)
Hemoglobin: 12.9 g/dL (ref 12.0–15.0)
MCH: 28.8 pg (ref 26.0–34.0)
MCHC: 33.5 g/dL (ref 30.0–36.0)
MCV: 85.9 fL (ref 80.0–100.0)
Platelets: 342 10*3/uL (ref 150–400)
RBC: 4.48 MIL/uL (ref 3.87–5.11)
RDW: 13.8 % (ref 11.5–15.5)
WBC: 6.5 10*3/uL (ref 4.0–10.5)
nRBC: 0 % (ref 0.0–0.2)

## 2019-05-07 LAB — LIPASE, BLOOD: Lipase: 22 U/L (ref 11–51)

## 2019-05-07 MED ORDER — IOHEXOL 300 MG/ML  SOLN
100.0000 mL | Freq: Once | INTRAMUSCULAR | Status: AC | PRN
  Administered 2019-05-07: 100 mL via INTRAVENOUS

## 2019-05-07 MED ORDER — HYDROMORPHONE HCL 1 MG/ML IJ SOLN
0.5000 mg | Freq: Once | INTRAMUSCULAR | Status: AC
  Administered 2019-05-07: 0.5 mg via INTRAVENOUS
  Filled 2019-05-07: qty 1

## 2019-05-07 MED ORDER — PREDNISONE 20 MG PO TABS: 40.0000 mg | ORAL_TABLET | Freq: Every day | ORAL | 0 refills | Status: DC

## 2019-05-07 MED ORDER — GABAPENTIN 100 MG PO CAPS: 100.0000 mg | ORAL_CAPSULE | Freq: Three times a day (TID) | ORAL | 0 refills | Status: AC

## 2019-05-07 NOTE — ED Notes (Signed)
Pt refused blood work in triage, states "they did all that on Friday when I was here' pt very anxious and tearful in triage, will not answer questions. Keeps replying "I dont know I was just here they should have it in my chart"

## 2019-05-07 NOTE — ED Provider Notes (Signed)
Patient presents with [facility] EMERGENCY DEPARTMENT Provider Note   CSN: [id number] Arrival date & time: 05/07/19  1107     History Chief Complaint  Patient presents with  . Leg Pain  . Abdominal Pain    Audrey Sanchez is a 73 y.o. female.  HPI Patient presents with left leg pain and abdominal pain.  Has had over the last 2 weeks.  Was seen in the ER and has been seen by PCP and orthopedics.  Reportedly had negative hip x-ray.  Has had negative ultrasound also.  Started in left proximal to mid thigh and has worked down somewhat but also goes up into the abdomen.  Has had some nausea and vomiting.  Pain also is in the lower back.  No trauma.  No weakness.  No loss of bladder or bowel control.  Patient states her orthopedic surgeon [facility] was going to do a MRI of the femur.  Has had decreased bowel movements and has had vomiting.  Has been on oxycodone and really does not help with the pain.  Patient's daughter is here and thinks the patient either has an inguinal or femoral hernia.  States that she looked it up online.    Past Medical History:  Diagnosis Date  . Hypertension     There are no problems to display for this patient.   Past Surgical History:  Procedure Laterality Date  . ABDOMINAL HYSTERECTOMY    . CHOLECYSTECTOMY       OB History   No obstetric history on file.     No family history on file.  Social History   Tobacco Use  . Smoking status: Never Smoker  . Smokeless tobacco: Never Used  Substance Use Topics  . Alcohol use: No  . Drug use: No    Home Medications Prior to Admission medications   Medication Sig Start Date End Date Taking? Authorizing Provider  cyclobenzaprine (FLEXERIL) 5 MG tablet Take 1 tablet (5 mg total) by mouth 3 (three) times daily as needed for muscle spasms. 05/04/19   Milagros Loll, MD  gabapentin (NEURONTIN) 100 MG capsule Take 1 capsule (100 mg total) by mouth 3 (three) times daily.  Increase to 2 capsules 3 times a day after 1 week. 05/07/19   Benjiman Core, MD  HYDROcodone-acetaminophen (NORCO/VICODIN) 5-325 MG tablet Take 1 tablet by mouth every 6 (six) hours as needed for moderate pain. 08/25/16   Lawyer, Cristal Deer, PA-C  predniSONE (DELTASONE) 20 MG tablet Take 2 tablets (40 mg total) by mouth daily. 05/07/19   Benjiman Core, MD  valsartan-hydrochlorothiazide (DIOVAN-HCT) 320-25 MG tablet Take 1 tablet by mouth daily.    [provider]    Allergies    Patient has no known allergies.  Review of Systems   Review of Systems  Constitutional: Negative for appetite change.  Respiratory: Negative for shortness of breath.   Gastrointestinal: Positive for abdominal pain and constipation.  Genitourinary: Negative for flank pain.  Musculoskeletal:       Left back groin and thigh pain.  Skin: Negative for rash.  Neurological: Negative for weakness.  Psychiatric/Behavioral: Negative for confusion.    Physical Exam Updated Vital Signs BP 115/76 (BP Location: Right Arm)   Pulse 87   Temp 97.7 F (36.5 C) (Oral)   Resp (!) 22   SpO2 100%   Physical Exam Vitals and nursing note reviewed.  Constitutional:      Comments: Patient appears uncomfortable  HENT:  Head: Atraumatic.  Pulmonary:     Breath sounds: Normal breath sounds.  Abdominal:     Comments: Tenderness in lower abdomen and left inguinal area.  No definite hernia palpated.  Skin:    General: Skin is warm.     Capillary Refill: Capillary refill takes less than 2 seconds.     Comments: No rash.  Tender over left SI area.  Good straight leg raise on left.  Some tenderness over right groin and the right proximal to mid thigh.  Sensation intact distally along with pulse.  Neurological:     Mental Status: She is alert.     ED Results / Procedures / Treatments   Labs (all labs ordered are listed, but only abnormal results are displayed) Labs Reviewed  COMPREHENSIVE METABOLIC PANEL  - Abnormal; Notable for the following components:      Result Value   Sodium 131 (*)    Chloride 95 (*)    All other components within normal limits  URINALYSIS, ROUTINE W REFLEX MICROSCOPIC - Abnormal; Notable for the following components:   pH 9.0 (*)    All other components within normal limits  LIPASE, BLOOD  CBC    EKG None  Radiology CT ABDOMEN PELVIS W CONTRAST  Result Date: 05/07/2019 CLINICAL DATA:  Abdominal pain, back and hip pain. EXAM: CT ABDOMEN AND PELVIS WITH CONTRAST TECHNIQUE: Multidetector CT imaging of the abdomen and pelvis was performed using the standard protocol following bolus administration of intravenous contrast. CONTRAST:  100mL OMNIPAQUE IOHEXOL 300 MG/ML  SOLN COMPARISON:  None. FINDINGS: Lower chest: Scarring in the lung bases.  No acute abnormality. Hepatobiliary: Prior cholecystectomy. Mild diffuse fatty infiltration of the liver. 5 cm simple appearing cyst posteriorly in the right hepatic lobe is stable since prior study. No biliary ductal dilatation. Pancreas: No focal abnormality or ductal dilatation. Spleen: No focal abnormality.  Normal size. Adrenals/Urinary Tract: No adrenal abnormality. No focal renal abnormality. No stones or hydronephrosis. Urinary bladder is unremarkable. Stomach/Bowel: Stomach, large and small bowel grossly unremarkable. Moderate stool burden in the colon. Vascular/Lymphatic: Aortic atherosclerosis. No enlarged abdominal or pelvic lymph nodes. Reproductive: Prior hysterectomy.  No adnexal masses. Other: No free fluid or free air. Musculoskeletal: No acute bony abnormality. IMPRESSION: Mild fatty infiltration of the liver. Moderate stool burden in the colon. Aortic atherosclerosis. No acute findings in the abdomen or pelvis. Electronically Signed   By: Charlett NoseKevin  Dover M.D.   On: 05/07/2019 15:08    Procedures Procedures (including critical care time)  Medications Ordered in ED Medications  HYDROmorphone (DILAUDID) injection 0.5 mg  (0.5 mg Intravenous Given 05/07/19 1343)  iohexol (OMNIPAQUE) 300 MG/ML solution 100 mL (100 mLs Intravenous Contrast Given 05/07/19 1452)    ED Course  I have reviewed the triage vital signs and the nursing notes.  Pertinent labs & imaging results that were available during my care of the patient were reviewed by me and considered in my medical decision making (see chart for details).    MDM Rules/Calculators/A&P                      Patient with pain in back and leg.  Has had good work-up already but continued pain.  CT scan done and overall reassuring.  Still constipation.  Will attempt to cut back on pain medicine.  Will add prednisone and Neurontin.  Discharge home with outpatient follow-up. Final Clinical Impression(s) / ED Diagnoses Final diagnoses:  Left thigh pain    Rx /  DC Orders ED Discharge Orders         Ordered    predniSONE (DELTASONE) 20 MG tablet  Daily     05/07/19 1531    gabapentin (NEURONTIN) 100 MG capsule  3 times daily     05/07/19 1531           Davonna Belling, MD 05/07/19 1533

## 2019-05-07 NOTE — ED Notes (Signed)
Pt ambulated with walker to the bathroom but unable to get a urine sample at this time.

## 2019-05-07 NOTE — ED Triage Notes (Signed)
Pt reports continued leg pain for the past 10 days, MRI done of pelvis, pain medications at home are not helping. Pt also reports difficulty urinating with constipation. Pt tender upon palpation in LLQ and back.   20G L FA, 232mcg fentanyl given by EMS with no improvement of pain.   Pt a.o, nad noted in triage.

## 2019-05-07 NOTE — Discharge Instructions (Addendum)
Follow-up with your doctors for further management of the pain.  For now take the prednisone and the Neurontin.  The Neurontin may need to be increased to be more effective.

## 2019-05-07 NOTE — ED Notes (Signed)
Pt sister sitting with her in the hallway.

## 2019-05-31 DIAGNOSIS — F339 Major depressive disorder, recurrent, unspecified: Secondary | ICD-10-CM | POA: Diagnosis not present

## 2019-05-31 DIAGNOSIS — I10 Essential (primary) hypertension: Secondary | ICD-10-CM | POA: Diagnosis not present

## 2019-05-31 DIAGNOSIS — E7800 Pure hypercholesterolemia, unspecified: Secondary | ICD-10-CM | POA: Diagnosis not present

## 2019-06-27 ENCOUNTER — Encounter: Payer: Self-pay | Admitting: *Deleted

## 2019-06-27 ENCOUNTER — Other Ambulatory Visit: Payer: Self-pay | Admitting: *Deleted

## 2019-07-02 ENCOUNTER — Encounter: Payer: Self-pay | Admitting: Diagnostic Neuroimaging

## 2019-07-02 ENCOUNTER — Other Ambulatory Visit: Payer: Self-pay

## 2019-07-02 ENCOUNTER — Ambulatory Visit: Payer: Medicare HMO | Admitting: Diagnostic Neuroimaging

## 2019-07-02 ENCOUNTER — Ambulatory Visit: Payer: Medicare HMO | Attending: Internal Medicine

## 2019-07-02 VITALS — BP 132/77 | HR 79 | Temp 97.3°F | Ht 60.0 in | Wt 150.6 lb

## 2019-07-02 DIAGNOSIS — Z23 Encounter for immunization: Secondary | ICD-10-CM | POA: Insufficient documentation

## 2019-07-02 DIAGNOSIS — M79605 Pain in left leg: Secondary | ICD-10-CM

## 2019-07-02 NOTE — Progress Notes (Signed)
   Covid-19 Vaccination Clinic  Name:  Audrey Sanchez    MRN: [id number] DOB: 02/10/1946  07/02/2019  [name] was observed post Covid-19 immunization for 15 minutes without incidence. She was provided with Vaccine Information Sheet and instruction to access the V-Safe system.   [name] was instructed to call 911 with any severe reactions post vaccine: Marland Kitchen Difficulty breathing  . Swelling of your face and throat  . A fast heartbeat  . A bad rash all over your body  . Dizziness and weakness    Immunizations Administered    Name Date Dose VIS Date Route   Pfizer COVID-19 Vaccine 07/02/2019  4:27 PM 0.3 mL 05/04/2019 Intramuscular   Manufacturer: [employer], [employer]   Lot: [id number]   NDC: [address]-0

## 2019-07-02 NOTE — Progress Notes (Signed)
[facility]  PATIENT: Audrey Sanchez DOB: 06-27-1945  REFERRING CLINICIAN: Juluis Rainier, MD HISTORY FROM: patient  REASON FOR VISIT: new consult    HISTORICAL  CHIEF COMPLAINT:  Chief Complaint  Patient presents with  . Left leg pain    rm 7 New Pt, left leg pain x few months, getting worse, gabapentin has relieved pan but leg is tender to touch- worse in evenings"    HISTORY OF PRESENT ILLNESS:   74 year old female here for evaluation of left leg pain.  December 2020 patient had onset of left inner thigh pain, radiating to the left anterior thigh.  Symptoms became very severe over several days and weeks.  Patient to the emergency room several times, PCP, orthopedic clinic for evaluation.  X-rays, ultrasound, imaging of the femur and hip were unremarkable.  CT of the abdomen pelvis also unremarkable.  Patient did not have MRI of the lumbar spine.  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  Since that time patient has retired.  Symptoms have significantly improved.  She is on gabapentin 200 in the morning, 200 at noon and 300 at night.     REVIEW OF SYSTEMS: Full 14 system review of systems performed and negative with exception of: As per HPI.  ALLERGIES: No Known Allergies  HOME MEDICATIONS: Outpatient Medications Prior to Visit  Medication Sig Dispense Refill  . aspirin EC 81 MG tablet Take 81 mg by mouth daily.    Marland Kitchen gabapentin (NEURONTIN) 100 MG capsule Take 1 capsule (100 mg total) by mouth 3 (three) times daily. Increase to 2 capsules 3 times a day after 1 week. 60 capsule 0  . sertraline (ZOLOFT) 100 MG tablet 100 mg daily.    . simvastatin (ZOCOR) 40 MG tablet Take 40 mg by mouth at bedtime.    . traZODone (DESYREL) 50 MG tablet Take by mouth daily. 1- 1.5 tabs daily    . valsartan-hydrochlorothiazide (DIOVAN-HCT) 320-25 MG tablet Take 1 tablet by mouth  daily.    . cyclobenzaprine (FLEXERIL) 5 MG tablet Take 1 tablet (5 mg total) by mouth 3 (three) times daily as needed for muscle spasms. 20 tablet 0  . HYDROcodone-acetaminophen (NORCO/VICODIN) 5-325 MG tablet Take 1 tablet by mouth every 6 (six) hours as needed for moderate pain. 15 tablet 0  . predniSONE (DELTASONE) 20 MG tablet Take 2 tablets (40 mg total) by mouth daily. 6 tablet 0   No facility-administered medications prior to visit.    PAST MEDICAL HISTORY: Past Medical History:  Diagnosis Date  . Anxiety   . Depression   . Diverticulitis    hx of  . Hyperlipidemia   . Hypertension   . Thyroid nodule     PAST SURGICAL HISTORY: Past Surgical History:  Procedure Laterality Date  . ABDOMINAL HYSTERECTOMY  1996  . BUNIONECTOMY  1995  . CATARACT EXTRACTION  11/2013  . CHOLECYSTECTOMY    . tonsillectomy     child    FAMILY HISTORY: Family History  Problem Relation Age of Onset  . Throat cancer Mother   . Diabetes Mother   . Hypertension Mother   . Cirrhosis Father   . Alcoholism Brother   . Stroke Brother   . Heart attack Brother   . Leukemia Maternal Grandmother   . Stroke Maternal Grandfather     SOCIAL HISTORY: Social History   Socioeconomic History  . Marital status: Single  Spouse name: Not on file  . Number of children: 2  . Years of education: 50  . Highest education level: Not on file  Occupational History    Comment: CNA, retired  Tobacco Use  . Smoking status: Never Smoker  . Smokeless tobacco: Never Used  Substance and Sexual Activity  . Alcohol use: No  . Drug use: No  . Sexual activity: Not on file  Other Topics Concern  . Not on file  Social History Narrative   Lives alone and has disabled son with her   No caffeine   Social Determinants of Health   Financial Resource Strain:   . Difficulty of Paying Living Expenses: Not on file  Food Insecurity:   . Worried About Charity fundraiser in the Last Year: Not on file  . Ran Out  of Food in the Last Year: Not on file  Transportation Needs:   . Lack of Transportation (Medical): Not on file  . Lack of Transportation (Non-Medical): Not on file  Physical Activity:   . Days of Exercise per Week: Not on file  . Minutes of Exercise per Session: Not on file  Stress:   . Feeling of Stress : Not on file  Social Connections:   . Frequency of Communication with Friends and Family: Not on file  . Frequency of Social Gatherings with Friends and Family: Not on file  . Attends Religious Services: Not on file  . Active Member of Clubs or Organizations: Not on file  . Attends Archivist Meetings: Not on file  . Marital Status: Not on file  Intimate Partner Violence:   . Fear of Current or Ex-Partner: Not on file  . Emotionally Abused: Not on file  . Physically Abused: Not on file  . Sexually Abused: Not on file     PHYSICAL EXAM  GENERAL EXAM/CONSTITUTIONAL: Vitals:  Vitals:   07/02/19 0942  BP: 132/77  Pulse: 79  Temp: (!) 97.3 F (36.3 C)  Weight: 150 lb 9.6 oz (68.3 kg)  Height: 5' (1.524 m)     Body mass index is 29.41 kg/m. Wt Readings from Last 3 Encounters:  07/02/19 150 lb 9.6 oz (68.3 kg)  04/25/19 152 lb (68.9 kg)  08/25/16 153 lb (69.4 kg)     Patient is in no distress; well developed, nourished and groomed; neck is supple  CARDIOVASCULAR:  Examination of carotid arteries is normal; no carotid bruits  Regular rate and rhythm, no murmurs  Examination of peripheral vascular system by observation and palpation is normal  EYES:  Ophthalmoscopic exam of optic discs and posterior segments is normal; no papilledema or hemorrhages  No exam data present  MUSCULOSKELETAL:  Gait, strength, tone, movements noted in Neurologic exam below  NEUROLOGIC: MENTAL STATUS:  No flowsheet data found.  awake, alert, oriented to person, place and time  recent and remote memory intact  normal attention and concentration  language fluent,  comprehension intact, naming intact  fund of knowledge appropriate  CRANIAL NERVE:   2nd - no papilledema on fundoscopic exam  2nd, 3rd, 4th, 6th - pupils equal and reactive to light, visual fields full to confrontation, extraocular muscles intact, no nystagmus  5th - facial sensation symmetric  7th - facial strength symmetric  8th - hearing intact  9th - palate elevates symmetrically, uvula midline  11th - shoulder shrug symmetric  12th - tongue protrusion midline  MOTOR:   normal bulk and tone, full strength in the BUE, BLE  SENSORY:   normal and symmetric to light touch, temperature, vibration  COORDINATION:   finger-nose-finger, fine finger movements normal  REFLEXES:   deep tendon reflexes TRACE and symmetric  GAIT/STATION:   narrow based gait; able to walk on toes, heels; TANDEM SLIGHTLY UNSTEADY; romberg is negative     DIAGNOSTIC DATA (LABS, IMAGING, TESTING) - I reviewed patient records, labs, notes, testing and imaging myself where available.  Lab Results  Component Value Date   WBC 6.5 05/07/2019   HGB 12.9 05/07/2019   HCT 38.5 05/07/2019   MCV 85.9 05/07/2019   PLT 342 05/07/2019      Component Value Date/Time   NA 131 (L) 05/07/2019 1128   K 4.7 05/07/2019 1128   CL 95 (L) 05/07/2019 1128   CO2 23 05/07/2019 1128   GLUCOSE 92 05/07/2019 1128   BUN 11 05/07/2019 1128   CREATININE 0.72 05/07/2019 1128   CALCIUM 9.6 05/07/2019 1128   PROT 7.2 05/07/2019 1128   ALBUMIN 4.1 05/07/2019 1128   AST 37 05/07/2019 1128   ALT 23 05/07/2019 1128   ALKPHOS 69 05/07/2019 1128   BILITOT 0.9 05/07/2019 1128   GFRNONAA >60 05/07/2019 1128   GFRAA >60 05/07/2019 1128   No results found for: CHOL, HDL, LDLCALC, LDLDIRECT, TRIG, CHOLHDL No results found for: RJJO8C No results found for: VITAMINB12 No results found for: TSH  05/07/19 CT abdomen / pelvis [I reviewed images myself and agree with interpretation. -VRP]  - Mild fatty infiltration  of the liver. - Moderate stool burden in the colon. - Aortic atherosclerosis. - No acute findings in the abdomen or pelvis.    ASSESSMENT AND PLAN  74 y.o. year old female here with new onset left anterior thigh pain in December 2020, in setting of increased physical activity, now improved with rest and gabapentin.  Ddx: lumbar radiculopathy, peripheral neuropathy, myopathy / myalgia  1. Left leg pain      PLAN:  LEFT LEG PAIN (improving) - optimize nutrition, mild exercises, sleep; consider PT evaluation if needed - continue gabapentin; patient will try weaning off as symptoms are greatly improved  POSITIVE ANA - monitor; may consider rheumatology consult if symptoms worsen  Return for pending if symptoms worsen or fail to improve, return to PCP.    Suanne Marker, MD 07/02/2019, 9:58 AM Certified in Neurology, Neurophysiology and Neuroimaging  [facility] [address], [address] [place], [place] [address] [phone]

## 2019-07-02 NOTE — Patient Instructions (Signed)
LEFT LEG PAIN (improving) - optimize nutrition, mild exercises, sleep; consider PT evaluation if needed - continue gabapentin; patient will try weaning off as symptoms are greatly improved

## 2019-07-11 DIAGNOSIS — M79605 Pain in left leg: Secondary | ICD-10-CM | POA: Diagnosis not present

## 2019-07-11 DIAGNOSIS — E663 Overweight: Secondary | ICD-10-CM | POA: Diagnosis not present

## 2019-07-11 DIAGNOSIS — Z6829 Body mass index (BMI) 29.0-29.9, adult: Secondary | ICD-10-CM | POA: Diagnosis not present

## 2019-07-26 DIAGNOSIS — M79605 Pain in left leg: Secondary | ICD-10-CM | POA: Diagnosis not present

## 2019-07-27 ENCOUNTER — Ambulatory Visit: Payer: Medicare HMO | Attending: Internal Medicine

## 2019-07-27 DIAGNOSIS — Z23 Encounter for immunization: Secondary | ICD-10-CM

## 2019-07-27 NOTE — Progress Notes (Signed)
   Covid-19 Vaccination Clinic  Name:  Audrey Sanchez    MRN: [id number] DOB: 01-10-1946  07/27/2019  Audrey Sanchez was observed post Covid-19 immunization for 15 minutes without incident. She was provided with Vaccine Information Sheet and instruction to access the V-Safe system.   Audrey Sanchez was instructed to call 911 with any severe reactions post vaccine: Marland Kitchen Difficulty breathing  . Swelling of face and throat  . A fast heartbeat  . A bad rash all over body  . Dizziness and weakness   Immunizations Administered    Name Date Dose VIS Date Route   Pfizer COVID-19 Vaccine 07/27/2019  2:05 AM 0.3 mL 05/04/2019 Intramuscular   Manufacturer: [employer], [employer]   Lot: [id number]   NDC: [address]-8

## 2019-07-31 DIAGNOSIS — M79606 Pain in leg, unspecified: Secondary | ICD-10-CM | POA: Diagnosis not present

## 2019-08-16 ENCOUNTER — Ambulatory Visit: Payer: Self-pay | Attending: Internal Medicine

## 2019-08-22 DIAGNOSIS — M9903 Segmental and somatic dysfunction of lumbar region: Secondary | ICD-10-CM | POA: Diagnosis not present

## 2019-08-22 DIAGNOSIS — M5136 Other intervertebral disc degeneration, lumbar region: Secondary | ICD-10-CM | POA: Diagnosis not present

## 2019-08-22 DIAGNOSIS — M5135 Other intervertebral disc degeneration, thoracolumbar region: Secondary | ICD-10-CM | POA: Diagnosis not present

## 2019-08-22 DIAGNOSIS — M5137 Other intervertebral disc degeneration, lumbosacral region: Secondary | ICD-10-CM | POA: Diagnosis not present

## 2019-08-22 DIAGNOSIS — M9902 Segmental and somatic dysfunction of thoracic region: Secondary | ICD-10-CM | POA: Diagnosis not present

## 2019-08-22 DIAGNOSIS — M9904 Segmental and somatic dysfunction of sacral region: Secondary | ICD-10-CM | POA: Diagnosis not present

## 2019-08-24 DIAGNOSIS — M9903 Segmental and somatic dysfunction of lumbar region: Secondary | ICD-10-CM | POA: Diagnosis not present

## 2019-08-24 DIAGNOSIS — M9904 Segmental and somatic dysfunction of sacral region: Secondary | ICD-10-CM | POA: Diagnosis not present

## 2019-08-24 DIAGNOSIS — M5135 Other intervertebral disc degeneration, thoracolumbar region: Secondary | ICD-10-CM | POA: Diagnosis not present

## 2019-08-24 DIAGNOSIS — M5137 Other intervertebral disc degeneration, lumbosacral region: Secondary | ICD-10-CM | POA: Diagnosis not present

## 2019-08-24 DIAGNOSIS — M9902 Segmental and somatic dysfunction of thoracic region: Secondary | ICD-10-CM | POA: Diagnosis not present

## 2019-08-24 DIAGNOSIS — M5136 Other intervertebral disc degeneration, lumbar region: Secondary | ICD-10-CM | POA: Diagnosis not present

## 2019-08-28 DIAGNOSIS — M545 Low back pain: Secondary | ICD-10-CM | POA: Diagnosis not present

## 2019-08-28 DIAGNOSIS — I10 Essential (primary) hypertension: Secondary | ICD-10-CM | POA: Diagnosis not present

## 2019-08-28 DIAGNOSIS — M79606 Pain in leg, unspecified: Secondary | ICD-10-CM | POA: Diagnosis not present

## 2019-08-28 DIAGNOSIS — F339 Major depressive disorder, recurrent, unspecified: Secondary | ICD-10-CM | POA: Diagnosis not present

## 2019-08-28 DIAGNOSIS — R7303 Prediabetes: Secondary | ICD-10-CM | POA: Diagnosis not present

## 2019-08-28 DIAGNOSIS — F411 Generalized anxiety disorder: Secondary | ICD-10-CM | POA: Diagnosis not present

## 2019-08-28 DIAGNOSIS — E7800 Pure hypercholesterolemia, unspecified: Secondary | ICD-10-CM | POA: Diagnosis not present

## 2019-08-29 DIAGNOSIS — M9902 Segmental and somatic dysfunction of thoracic region: Secondary | ICD-10-CM | POA: Diagnosis not present

## 2019-08-29 DIAGNOSIS — M9904 Segmental and somatic dysfunction of sacral region: Secondary | ICD-10-CM | POA: Diagnosis not present

## 2019-08-29 DIAGNOSIS — M5137 Other intervertebral disc degeneration, lumbosacral region: Secondary | ICD-10-CM | POA: Diagnosis not present

## 2019-08-29 DIAGNOSIS — M9903 Segmental and somatic dysfunction of lumbar region: Secondary | ICD-10-CM | POA: Diagnosis not present

## 2019-08-29 DIAGNOSIS — M5136 Other intervertebral disc degeneration, lumbar region: Secondary | ICD-10-CM | POA: Diagnosis not present

## 2019-08-29 DIAGNOSIS — M5135 Other intervertebral disc degeneration, thoracolumbar region: Secondary | ICD-10-CM | POA: Diagnosis not present

## 2019-08-31 DIAGNOSIS — M9904 Segmental and somatic dysfunction of sacral region: Secondary | ICD-10-CM | POA: Diagnosis not present

## 2019-08-31 DIAGNOSIS — M5137 Other intervertebral disc degeneration, lumbosacral region: Secondary | ICD-10-CM | POA: Diagnosis not present

## 2019-08-31 DIAGNOSIS — M9902 Segmental and somatic dysfunction of thoracic region: Secondary | ICD-10-CM | POA: Diagnosis not present

## 2019-08-31 DIAGNOSIS — M5135 Other intervertebral disc degeneration, thoracolumbar region: Secondary | ICD-10-CM | POA: Diagnosis not present

## 2019-08-31 DIAGNOSIS — M9903 Segmental and somatic dysfunction of lumbar region: Secondary | ICD-10-CM | POA: Diagnosis not present

## 2019-08-31 DIAGNOSIS — M5136 Other intervertebral disc degeneration, lumbar region: Secondary | ICD-10-CM | POA: Diagnosis not present

## 2019-09-05 DIAGNOSIS — M5135 Other intervertebral disc degeneration, thoracolumbar region: Secondary | ICD-10-CM | POA: Diagnosis not present

## 2019-09-05 DIAGNOSIS — M9903 Segmental and somatic dysfunction of lumbar region: Secondary | ICD-10-CM | POA: Diagnosis not present

## 2019-09-05 DIAGNOSIS — M5136 Other intervertebral disc degeneration, lumbar region: Secondary | ICD-10-CM | POA: Diagnosis not present

## 2019-09-05 DIAGNOSIS — M9902 Segmental and somatic dysfunction of thoracic region: Secondary | ICD-10-CM | POA: Diagnosis not present

## 2019-09-05 DIAGNOSIS — M9904 Segmental and somatic dysfunction of sacral region: Secondary | ICD-10-CM | POA: Diagnosis not present

## 2019-09-05 DIAGNOSIS — M5137 Other intervertebral disc degeneration, lumbosacral region: Secondary | ICD-10-CM | POA: Diagnosis not present

## 2019-09-07 DIAGNOSIS — M5136 Other intervertebral disc degeneration, lumbar region: Secondary | ICD-10-CM | POA: Diagnosis not present

## 2019-09-07 DIAGNOSIS — M9902 Segmental and somatic dysfunction of thoracic region: Secondary | ICD-10-CM | POA: Diagnosis not present

## 2019-09-07 DIAGNOSIS — M9903 Segmental and somatic dysfunction of lumbar region: Secondary | ICD-10-CM | POA: Diagnosis not present

## 2019-09-07 DIAGNOSIS — M5137 Other intervertebral disc degeneration, lumbosacral region: Secondary | ICD-10-CM | POA: Diagnosis not present

## 2019-09-07 DIAGNOSIS — M9904 Segmental and somatic dysfunction of sacral region: Secondary | ICD-10-CM | POA: Diagnosis not present

## 2019-09-07 DIAGNOSIS — M5135 Other intervertebral disc degeneration, thoracolumbar region: Secondary | ICD-10-CM | POA: Diagnosis not present

## 2019-09-11 DIAGNOSIS — G479 Sleep disorder, unspecified: Secondary | ICD-10-CM | POA: Diagnosis not present

## 2019-09-11 DIAGNOSIS — M5135 Other intervertebral disc degeneration, thoracolumbar region: Secondary | ICD-10-CM | POA: Diagnosis not present

## 2019-09-11 DIAGNOSIS — M5136 Other intervertebral disc degeneration, lumbar region: Secondary | ICD-10-CM | POA: Diagnosis not present

## 2019-09-11 DIAGNOSIS — F411 Generalized anxiety disorder: Secondary | ICD-10-CM | POA: Diagnosis not present

## 2019-09-11 DIAGNOSIS — M9904 Segmental and somatic dysfunction of sacral region: Secondary | ICD-10-CM | POA: Diagnosis not present

## 2019-09-11 DIAGNOSIS — M9902 Segmental and somatic dysfunction of thoracic region: Secondary | ICD-10-CM | POA: Diagnosis not present

## 2019-09-11 DIAGNOSIS — M9903 Segmental and somatic dysfunction of lumbar region: Secondary | ICD-10-CM | POA: Diagnosis not present

## 2019-09-11 DIAGNOSIS — R2689 Other abnormalities of gait and mobility: Secondary | ICD-10-CM | POA: Diagnosis not present

## 2019-09-11 DIAGNOSIS — E7800 Pure hypercholesterolemia, unspecified: Secondary | ICD-10-CM | POA: Diagnosis not present

## 2019-09-11 DIAGNOSIS — I10 Essential (primary) hypertension: Secondary | ICD-10-CM | POA: Diagnosis not present

## 2019-09-11 DIAGNOSIS — F339 Major depressive disorder, recurrent, unspecified: Secondary | ICD-10-CM | POA: Diagnosis not present

## 2019-09-11 DIAGNOSIS — R7303 Prediabetes: Secondary | ICD-10-CM | POA: Diagnosis not present

## 2019-09-11 DIAGNOSIS — M5137 Other intervertebral disc degeneration, lumbosacral region: Secondary | ICD-10-CM | POA: Diagnosis not present

## 2019-09-13 DIAGNOSIS — M5136 Other intervertebral disc degeneration, lumbar region: Secondary | ICD-10-CM | POA: Diagnosis not present

## 2019-09-13 DIAGNOSIS — M9902 Segmental and somatic dysfunction of thoracic region: Secondary | ICD-10-CM | POA: Diagnosis not present

## 2019-09-13 DIAGNOSIS — M5137 Other intervertebral disc degeneration, lumbosacral region: Secondary | ICD-10-CM | POA: Diagnosis not present

## 2019-09-13 DIAGNOSIS — M9903 Segmental and somatic dysfunction of lumbar region: Secondary | ICD-10-CM | POA: Diagnosis not present

## 2019-09-13 DIAGNOSIS — M9904 Segmental and somatic dysfunction of sacral region: Secondary | ICD-10-CM | POA: Diagnosis not present

## 2019-09-13 DIAGNOSIS — M5135 Other intervertebral disc degeneration, thoracolumbar region: Secondary | ICD-10-CM | POA: Diagnosis not present

## 2019-10-15 DIAGNOSIS — R270 Ataxia, unspecified: Secondary | ICD-10-CM | POA: Diagnosis not present

## 2019-10-15 DIAGNOSIS — R2689 Other abnormalities of gait and mobility: Secondary | ICD-10-CM | POA: Diagnosis not present

## 2019-11-27 DIAGNOSIS — E7800 Pure hypercholesterolemia, unspecified: Secondary | ICD-10-CM | POA: Diagnosis not present

## 2019-11-27 DIAGNOSIS — I10 Essential (primary) hypertension: Secondary | ICD-10-CM | POA: Diagnosis not present

## 2019-11-27 DIAGNOSIS — F339 Major depressive disorder, recurrent, unspecified: Secondary | ICD-10-CM | POA: Diagnosis not present

## 2019-11-30 DIAGNOSIS — R270 Ataxia, unspecified: Secondary | ICD-10-CM | POA: Diagnosis not present

## 2019-12-13 DIAGNOSIS — H5212 Myopia, left eye: Secondary | ICD-10-CM | POA: Diagnosis not present

## 2019-12-21 DIAGNOSIS — R262 Difficulty in walking, not elsewhere classified: Secondary | ICD-10-CM | POA: Diagnosis not present

## 2019-12-25 DIAGNOSIS — R262 Difficulty in walking, not elsewhere classified: Secondary | ICD-10-CM | POA: Diagnosis not present

## 2020-01-01 DIAGNOSIS — R262 Difficulty in walking, not elsewhere classified: Secondary | ICD-10-CM | POA: Diagnosis not present

## 2020-01-03 DIAGNOSIS — R262 Difficulty in walking, not elsewhere classified: Secondary | ICD-10-CM | POA: Diagnosis not present

## 2020-01-07 DIAGNOSIS — R262 Difficulty in walking, not elsewhere classified: Secondary | ICD-10-CM | POA: Diagnosis not present

## 2020-01-10 DIAGNOSIS — R262 Difficulty in walking, not elsewhere classified: Secondary | ICD-10-CM | POA: Diagnosis not present

## 2020-01-15 DIAGNOSIS — R262 Difficulty in walking, not elsewhere classified: Secondary | ICD-10-CM | POA: Diagnosis not present

## 2020-01-17 DIAGNOSIS — R262 Difficulty in walking, not elsewhere classified: Secondary | ICD-10-CM | POA: Diagnosis not present

## 2020-01-29 DIAGNOSIS — H43391 Other vitreous opacities, right eye: Secondary | ICD-10-CM | POA: Diagnosis not present

## 2020-01-29 DIAGNOSIS — H43811 Vitreous degeneration, right eye: Secondary | ICD-10-CM | POA: Diagnosis not present

## 2020-02-24 DIAGNOSIS — L255 Unspecified contact dermatitis due to plants, except food: Secondary | ICD-10-CM | POA: Diagnosis not present

## 2020-03-13 DIAGNOSIS — R2689 Other abnormalities of gait and mobility: Secondary | ICD-10-CM | POA: Diagnosis not present

## 2020-03-13 DIAGNOSIS — R270 Ataxia, unspecified: Secondary | ICD-10-CM | POA: Diagnosis not present

## 2020-03-13 DIAGNOSIS — R29898 Other symptoms and signs involving the musculoskeletal system: Secondary | ICD-10-CM | POA: Diagnosis not present

## 2020-03-19 DIAGNOSIS — F3342 Major depressive disorder, recurrent, in full remission: Secondary | ICD-10-CM | POA: Diagnosis not present

## 2020-03-19 DIAGNOSIS — F339 Major depressive disorder, recurrent, unspecified: Secondary | ICD-10-CM | POA: Diagnosis not present

## 2020-03-19 DIAGNOSIS — I10 Essential (primary) hypertension: Secondary | ICD-10-CM | POA: Diagnosis not present

## 2020-03-19 DIAGNOSIS — E7800 Pure hypercholesterolemia, unspecified: Secondary | ICD-10-CM | POA: Diagnosis not present

## 2020-03-27 DIAGNOSIS — R5383 Other fatigue: Secondary | ICD-10-CM | POA: Diagnosis not present

## 2020-03-27 DIAGNOSIS — I10 Essential (primary) hypertension: Secondary | ICD-10-CM | POA: Diagnosis not present

## 2020-03-27 DIAGNOSIS — R7303 Prediabetes: Secondary | ICD-10-CM | POA: Diagnosis not present

## 2020-03-27 DIAGNOSIS — R2689 Other abnormalities of gait and mobility: Secondary | ICD-10-CM | POA: Diagnosis not present

## 2020-03-27 DIAGNOSIS — E7800 Pure hypercholesterolemia, unspecified: Secondary | ICD-10-CM | POA: Diagnosis not present

## 2020-03-27 DIAGNOSIS — Z8639 Personal history of other endocrine, nutritional and metabolic disease: Secondary | ICD-10-CM | POA: Diagnosis not present

## 2020-03-27 DIAGNOSIS — F3342 Major depressive disorder, recurrent, in full remission: Secondary | ICD-10-CM | POA: Diagnosis not present

## 2020-03-27 DIAGNOSIS — Z0000 Encounter for general adult medical examination without abnormal findings: Secondary | ICD-10-CM | POA: Diagnosis not present

## 2020-03-27 DIAGNOSIS — E559 Vitamin D deficiency, unspecified: Secondary | ICD-10-CM | POA: Diagnosis not present

## 2020-04-01 DIAGNOSIS — M5135 Other intervertebral disc degeneration, thoracolumbar region: Secondary | ICD-10-CM | POA: Diagnosis not present

## 2020-04-01 DIAGNOSIS — M5127 Other intervertebral disc displacement, lumbosacral region: Secondary | ICD-10-CM | POA: Diagnosis not present

## 2020-04-01 DIAGNOSIS — R2689 Other abnormalities of gait and mobility: Secondary | ICD-10-CM | POA: Diagnosis not present

## 2020-04-01 DIAGNOSIS — M5136 Other intervertebral disc degeneration, lumbar region: Secondary | ICD-10-CM | POA: Diagnosis not present

## 2020-04-01 DIAGNOSIS — R270 Ataxia, unspecified: Secondary | ICD-10-CM | POA: Diagnosis not present

## 2020-04-01 DIAGNOSIS — M5137 Other intervertebral disc degeneration, lumbosacral region: Secondary | ICD-10-CM | POA: Diagnosis not present

## 2020-04-01 DIAGNOSIS — R29898 Other symptoms and signs involving the musculoskeletal system: Secondary | ICD-10-CM | POA: Diagnosis not present

## 2020-04-02 DIAGNOSIS — G5712 Meralgia paresthetica, left lower limb: Secondary | ICD-10-CM | POA: Diagnosis not present

## 2020-04-23 DIAGNOSIS — Z0389 Encounter for observation for other suspected diseases and conditions ruled out: Secondary | ICD-10-CM | POA: Diagnosis not present

## 2020-04-23 DIAGNOSIS — Z1231 Encounter for screening mammogram for malignant neoplasm of breast: Secondary | ICD-10-CM | POA: Diagnosis not present

## 2020-05-15 DIAGNOSIS — M545 Low back pain, unspecified: Secondary | ICD-10-CM | POA: Diagnosis not present

## 2020-05-15 DIAGNOSIS — R2689 Other abnormalities of gait and mobility: Secondary | ICD-10-CM | POA: Diagnosis not present

## 2020-05-15 DIAGNOSIS — M48061 Spinal stenosis, lumbar region without neurogenic claudication: Secondary | ICD-10-CM | POA: Diagnosis not present

## 2020-05-15 DIAGNOSIS — G8929 Other chronic pain: Secondary | ICD-10-CM | POA: Diagnosis not present

## 2020-05-15 DIAGNOSIS — M5136 Other intervertebral disc degeneration, lumbar region: Secondary | ICD-10-CM | POA: Diagnosis not present

## 2020-05-22 DIAGNOSIS — M47812 Spondylosis without myelopathy or radiculopathy, cervical region: Secondary | ICD-10-CM | POA: Diagnosis not present

## 2020-05-22 DIAGNOSIS — M4802 Spinal stenosis, cervical region: Secondary | ICD-10-CM | POA: Diagnosis not present

## 2020-05-22 DIAGNOSIS — M5032 Other cervical disc degeneration, mid-cervical region, unspecified level: Secondary | ICD-10-CM | POA: Diagnosis not present

## 2020-05-22 DIAGNOSIS — R2689 Other abnormalities of gait and mobility: Secondary | ICD-10-CM | POA: Diagnosis not present

## 2020-06-10 DIAGNOSIS — R2689 Other abnormalities of gait and mobility: Secondary | ICD-10-CM | POA: Diagnosis not present

## 2020-06-18 DIAGNOSIS — M6281 Muscle weakness (generalized): Secondary | ICD-10-CM | POA: Diagnosis not present

## 2020-06-18 DIAGNOSIS — R262 Difficulty in walking, not elsewhere classified: Secondary | ICD-10-CM | POA: Diagnosis not present

## 2020-06-18 DIAGNOSIS — R2689 Other abnormalities of gait and mobility: Secondary | ICD-10-CM | POA: Diagnosis not present

## 2020-06-25 DIAGNOSIS — R2689 Other abnormalities of gait and mobility: Secondary | ICD-10-CM | POA: Diagnosis not present

## 2020-06-25 DIAGNOSIS — R262 Difficulty in walking, not elsewhere classified: Secondary | ICD-10-CM | POA: Diagnosis not present

## 2020-06-25 DIAGNOSIS — M6281 Muscle weakness (generalized): Secondary | ICD-10-CM | POA: Diagnosis not present

## 2020-06-26 DIAGNOSIS — M6281 Muscle weakness (generalized): Secondary | ICD-10-CM | POA: Diagnosis not present

## 2020-06-26 DIAGNOSIS — R2689 Other abnormalities of gait and mobility: Secondary | ICD-10-CM | POA: Diagnosis not present

## 2020-06-26 DIAGNOSIS — R262 Difficulty in walking, not elsewhere classified: Secondary | ICD-10-CM | POA: Diagnosis not present

## 2020-07-01 DIAGNOSIS — R262 Difficulty in walking, not elsewhere classified: Secondary | ICD-10-CM | POA: Diagnosis not present

## 2020-07-01 DIAGNOSIS — R2689 Other abnormalities of gait and mobility: Secondary | ICD-10-CM | POA: Diagnosis not present

## 2020-07-01 DIAGNOSIS — M6281 Muscle weakness (generalized): Secondary | ICD-10-CM | POA: Diagnosis not present

## 2020-07-03 DIAGNOSIS — R2689 Other abnormalities of gait and mobility: Secondary | ICD-10-CM | POA: Diagnosis not present

## 2020-07-03 DIAGNOSIS — M6281 Muscle weakness (generalized): Secondary | ICD-10-CM | POA: Diagnosis not present

## 2020-07-03 DIAGNOSIS — R262 Difficulty in walking, not elsewhere classified: Secondary | ICD-10-CM | POA: Diagnosis not present

## 2020-07-08 DIAGNOSIS — R262 Difficulty in walking, not elsewhere classified: Secondary | ICD-10-CM | POA: Diagnosis not present

## 2020-07-08 DIAGNOSIS — R2689 Other abnormalities of gait and mobility: Secondary | ICD-10-CM | POA: Diagnosis not present

## 2020-07-08 DIAGNOSIS — M6281 Muscle weakness (generalized): Secondary | ICD-10-CM | POA: Diagnosis not present

## 2020-07-10 DIAGNOSIS — M6281 Muscle weakness (generalized): Secondary | ICD-10-CM | POA: Diagnosis not present

## 2020-07-10 DIAGNOSIS — R262 Difficulty in walking, not elsewhere classified: Secondary | ICD-10-CM | POA: Diagnosis not present

## 2020-07-10 DIAGNOSIS — R2689 Other abnormalities of gait and mobility: Secondary | ICD-10-CM | POA: Diagnosis not present

## 2020-07-15 DIAGNOSIS — M6281 Muscle weakness (generalized): Secondary | ICD-10-CM | POA: Diagnosis not present

## 2020-07-15 DIAGNOSIS — R262 Difficulty in walking, not elsewhere classified: Secondary | ICD-10-CM | POA: Diagnosis not present

## 2020-07-15 DIAGNOSIS — R2689 Other abnormalities of gait and mobility: Secondary | ICD-10-CM | POA: Diagnosis not present

## 2020-07-17 DIAGNOSIS — M6281 Muscle weakness (generalized): Secondary | ICD-10-CM | POA: Diagnosis not present

## 2020-07-17 DIAGNOSIS — R2689 Other abnormalities of gait and mobility: Secondary | ICD-10-CM | POA: Diagnosis not present

## 2020-07-17 DIAGNOSIS — R262 Difficulty in walking, not elsewhere classified: Secondary | ICD-10-CM | POA: Diagnosis not present

## 2020-07-22 DIAGNOSIS — M6281 Muscle weakness (generalized): Secondary | ICD-10-CM | POA: Diagnosis not present

## 2020-07-22 DIAGNOSIS — R2689 Other abnormalities of gait and mobility: Secondary | ICD-10-CM | POA: Diagnosis not present

## 2020-07-22 DIAGNOSIS — R262 Difficulty in walking, not elsewhere classified: Secondary | ICD-10-CM | POA: Diagnosis not present

## 2020-07-24 DIAGNOSIS — R262 Difficulty in walking, not elsewhere classified: Secondary | ICD-10-CM | POA: Diagnosis not present

## 2020-07-24 DIAGNOSIS — R2689 Other abnormalities of gait and mobility: Secondary | ICD-10-CM | POA: Diagnosis not present

## 2020-07-24 DIAGNOSIS — M6281 Muscle weakness (generalized): Secondary | ICD-10-CM | POA: Diagnosis not present

## 2020-07-29 DIAGNOSIS — M6281 Muscle weakness (generalized): Secondary | ICD-10-CM | POA: Diagnosis not present

## 2020-07-29 DIAGNOSIS — R2689 Other abnormalities of gait and mobility: Secondary | ICD-10-CM | POA: Diagnosis not present

## 2020-07-29 DIAGNOSIS — R262 Difficulty in walking, not elsewhere classified: Secondary | ICD-10-CM | POA: Diagnosis not present

## 2020-07-31 DIAGNOSIS — R69 Illness, unspecified: Secondary | ICD-10-CM | POA: Diagnosis not present

## 2020-09-04 DIAGNOSIS — L600 Ingrowing nail: Secondary | ICD-10-CM | POA: Diagnosis not present

## 2020-09-04 DIAGNOSIS — M79674 Pain in right toe(s): Secondary | ICD-10-CM | POA: Diagnosis not present

## 2020-09-04 DIAGNOSIS — M79675 Pain in left toe(s): Secondary | ICD-10-CM | POA: Diagnosis not present

## 2020-09-04 DIAGNOSIS — B351 Tinea unguium: Secondary | ICD-10-CM | POA: Diagnosis not present

## 2020-10-02 DIAGNOSIS — L600 Ingrowing nail: Secondary | ICD-10-CM | POA: Diagnosis not present

## 2020-10-02 DIAGNOSIS — B351 Tinea unguium: Secondary | ICD-10-CM | POA: Diagnosis not present

## 2020-10-02 DIAGNOSIS — M79674 Pain in right toe(s): Secondary | ICD-10-CM | POA: Diagnosis not present

## 2020-10-02 DIAGNOSIS — M79675 Pain in left toe(s): Secondary | ICD-10-CM | POA: Diagnosis not present

## 2020-10-30 DIAGNOSIS — B351 Tinea unguium: Secondary | ICD-10-CM | POA: Diagnosis not present

## 2020-10-30 DIAGNOSIS — M79674 Pain in right toe(s): Secondary | ICD-10-CM | POA: Diagnosis not present

## 2020-10-30 DIAGNOSIS — L600 Ingrowing nail: Secondary | ICD-10-CM | POA: Diagnosis not present

## 2020-10-30 DIAGNOSIS — M79675 Pain in left toe(s): Secondary | ICD-10-CM | POA: Diagnosis not present

## 2020-10-31 DIAGNOSIS — E538 Deficiency of other specified B group vitamins: Secondary | ICD-10-CM | POA: Diagnosis not present

## 2020-10-31 DIAGNOSIS — E559 Vitamin D deficiency, unspecified: Secondary | ICD-10-CM | POA: Diagnosis not present

## 2020-11-27 DIAGNOSIS — L600 Ingrowing nail: Secondary | ICD-10-CM | POA: Diagnosis not present

## 2020-11-27 DIAGNOSIS — M79674 Pain in right toe(s): Secondary | ICD-10-CM | POA: Diagnosis not present

## 2020-11-27 DIAGNOSIS — M79675 Pain in left toe(s): Secondary | ICD-10-CM | POA: Diagnosis not present

## 2020-11-27 DIAGNOSIS — B351 Tinea unguium: Secondary | ICD-10-CM | POA: Diagnosis not present

## 2020-12-09 DIAGNOSIS — H524 Presbyopia: Secondary | ICD-10-CM | POA: Diagnosis not present

## 2021-03-30 DIAGNOSIS — H52223 Regular astigmatism, bilateral: Secondary | ICD-10-CM | POA: Diagnosis not present

## 2021-03-30 DIAGNOSIS — H524 Presbyopia: Secondary | ICD-10-CM | POA: Diagnosis not present

## 2021-04-27 DIAGNOSIS — Z13220 Encounter for screening for lipoid disorders: Secondary | ICD-10-CM | POA: Diagnosis not present

## 2021-04-27 DIAGNOSIS — R2689 Other abnormalities of gait and mobility: Secondary | ICD-10-CM | POA: Diagnosis not present

## 2021-04-27 DIAGNOSIS — D649 Anemia, unspecified: Secondary | ICD-10-CM | POA: Diagnosis not present

## 2021-04-27 DIAGNOSIS — F339 Major depressive disorder, recurrent, unspecified: Secondary | ICD-10-CM | POA: Diagnosis not present

## 2021-04-27 DIAGNOSIS — Z131 Encounter for screening for diabetes mellitus: Secondary | ICD-10-CM | POA: Diagnosis not present

## 2021-04-27 DIAGNOSIS — E7800 Pure hypercholesterolemia, unspecified: Secondary | ICD-10-CM | POA: Diagnosis not present

## 2021-04-27 DIAGNOSIS — Z23 Encounter for immunization: Secondary | ICD-10-CM | POA: Diagnosis not present

## 2021-04-27 DIAGNOSIS — R7309 Other abnormal glucose: Secondary | ICD-10-CM | POA: Diagnosis not present

## 2021-04-27 DIAGNOSIS — Z0000 Encounter for general adult medical examination without abnormal findings: Secondary | ICD-10-CM | POA: Diagnosis not present

## 2021-04-29 DIAGNOSIS — Z1231 Encounter for screening mammogram for malignant neoplasm of breast: Secondary | ICD-10-CM | POA: Diagnosis not present

## 2021-05-07 DIAGNOSIS — N6489 Other specified disorders of breast: Secondary | ICD-10-CM | POA: Diagnosis not present

## 2021-05-07 DIAGNOSIS — R918 Other nonspecific abnormal finding of lung field: Secondary | ICD-10-CM | POA: Diagnosis not present

## 2021-05-07 DIAGNOSIS — Z8701 Personal history of pneumonia (recurrent): Secondary | ICD-10-CM | POA: Diagnosis not present

## 2021-05-07 DIAGNOSIS — R922 Inconclusive mammogram: Secondary | ICD-10-CM | POA: Diagnosis not present

## 2021-05-07 DIAGNOSIS — R928 Other abnormal and inconclusive findings on diagnostic imaging of breast: Secondary | ICD-10-CM | POA: Diagnosis not present

## 2021-05-07 DIAGNOSIS — R590 Localized enlarged lymph nodes: Secondary | ICD-10-CM | POA: Diagnosis not present

## 2021-08-03 DIAGNOSIS — D509 Iron deficiency anemia, unspecified: Secondary | ICD-10-CM | POA: Diagnosis not present

## 2021-08-03 DIAGNOSIS — R7303 Prediabetes: Secondary | ICD-10-CM | POA: Diagnosis not present

## 2021-09-29 DIAGNOSIS — M545 Low back pain, unspecified: Secondary | ICD-10-CM | POA: Diagnosis not present

## 2021-09-29 DIAGNOSIS — D509 Iron deficiency anemia, unspecified: Secondary | ICD-10-CM | POA: Diagnosis not present

## 2021-10-07 DIAGNOSIS — H903 Sensorineural hearing loss, bilateral: Secondary | ICD-10-CM | POA: Diagnosis not present

## 2021-10-07 IMAGING — US US EXTREM LOW VENOUS*L*
1 series · 13 of 24 positions shown · non-contrast
Comparison: None.

CLINICAL DATA: Left lower extremity pain for the past 3-4 days.
Evaluate for DVT.



[Series 1: us extrem low venous*left* · 13 of 37 slices shown]
[im 1/37]
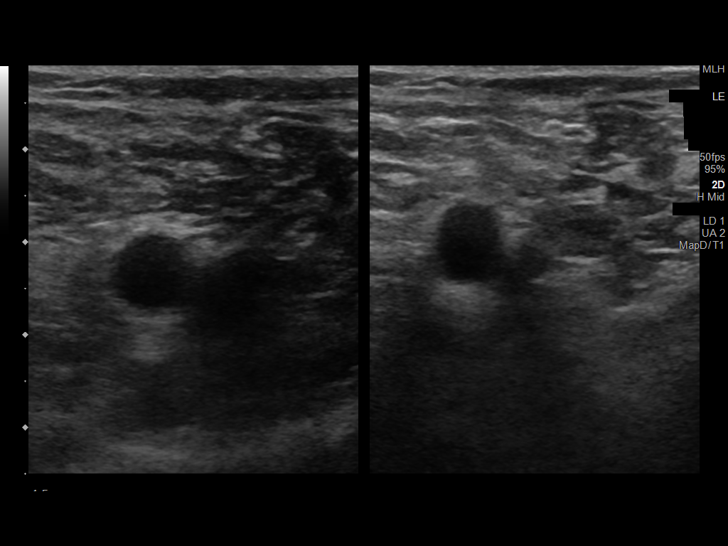
[im 4/37]
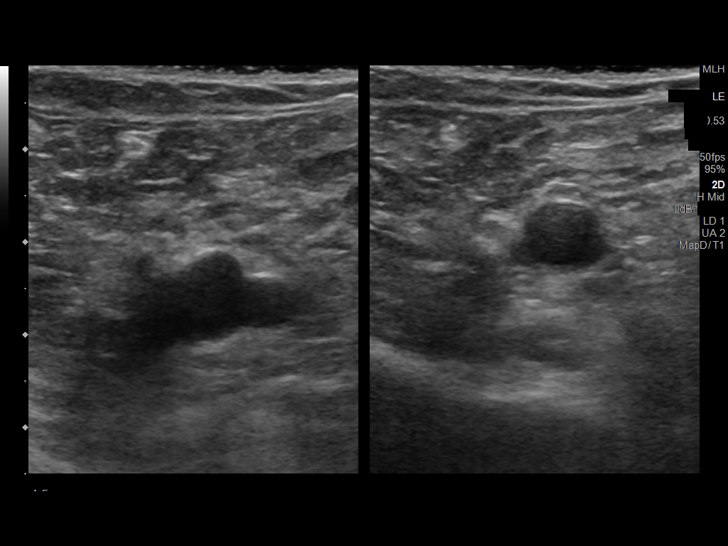
[im 7/37]
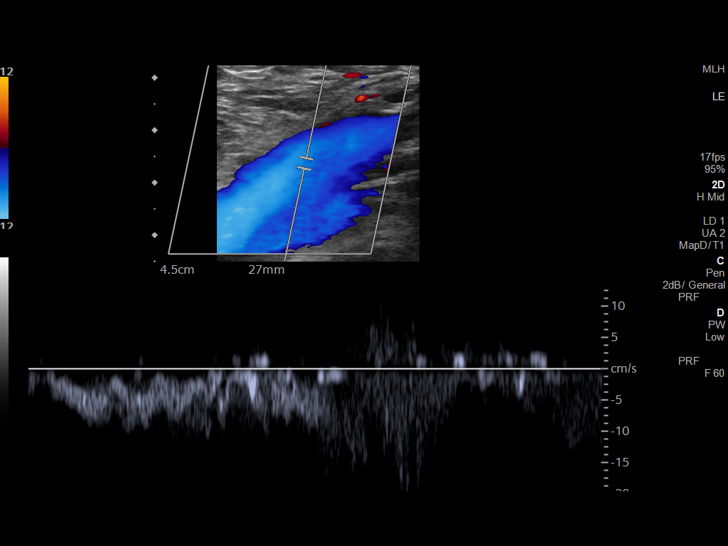
[im 10/37]
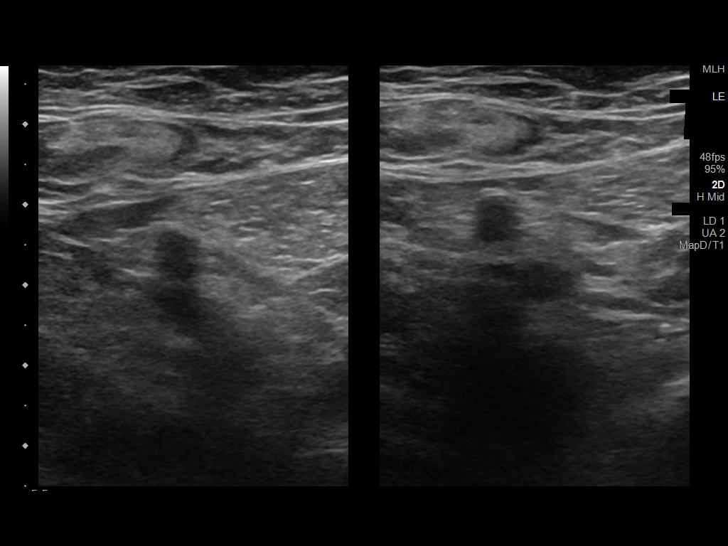
[im 13/37]
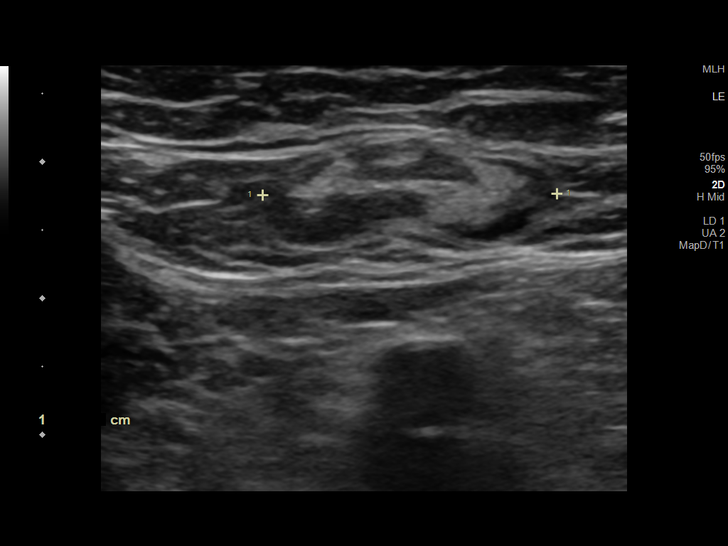
[im 16/37]
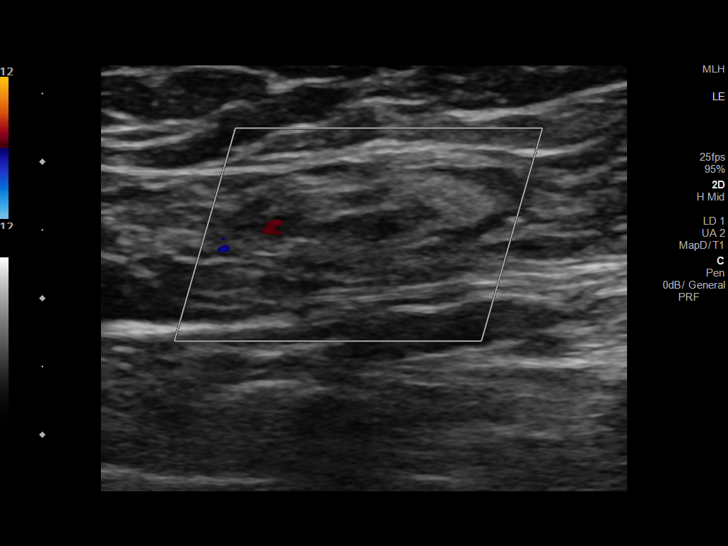
[im 19/37]
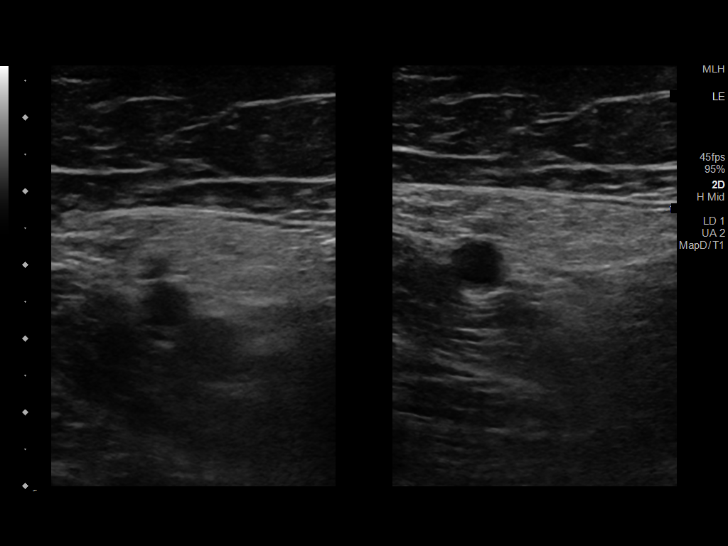
[im 21/37]
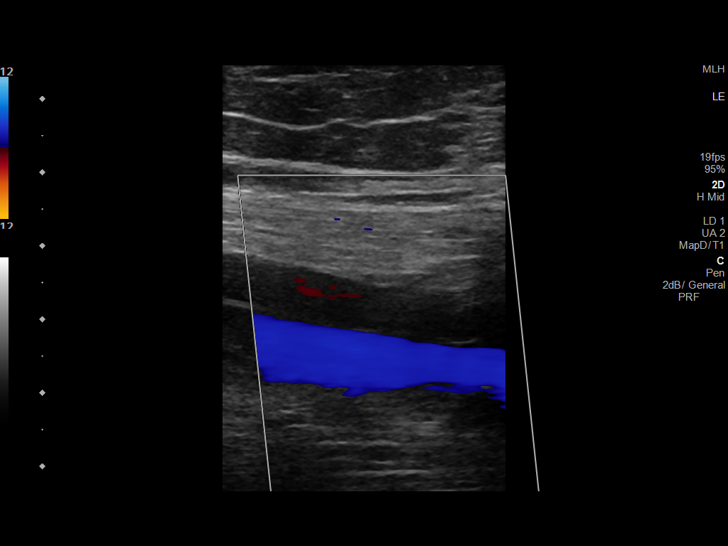
[im 24/37]
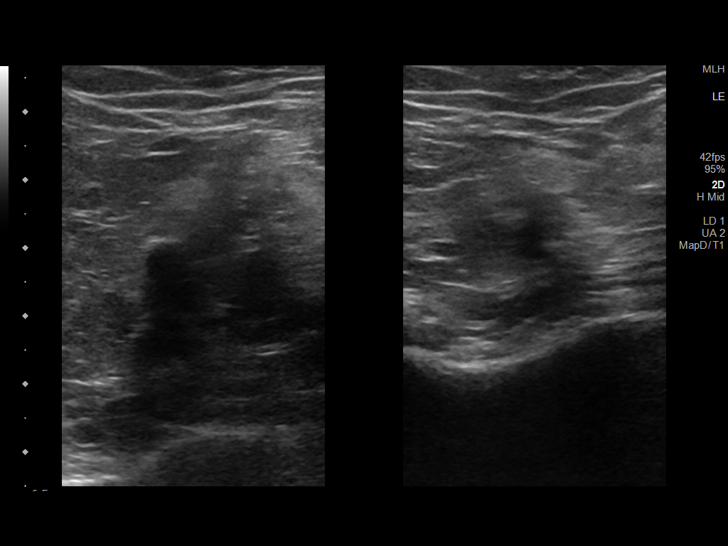
[im 27/37]
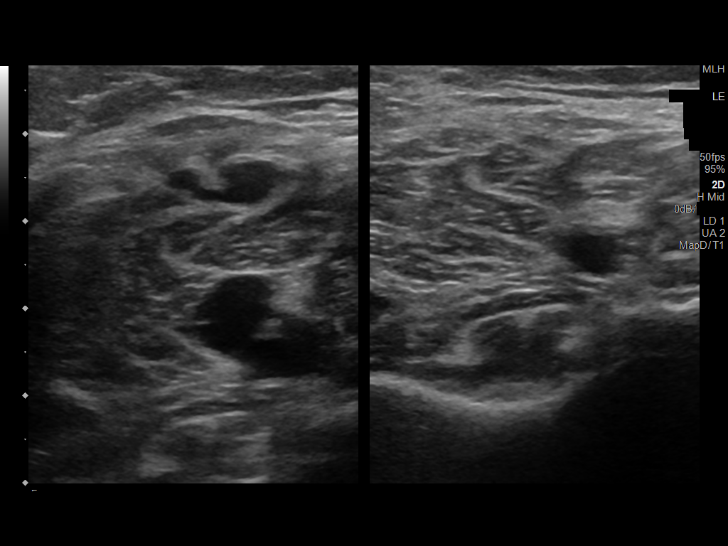
[im 30/37]
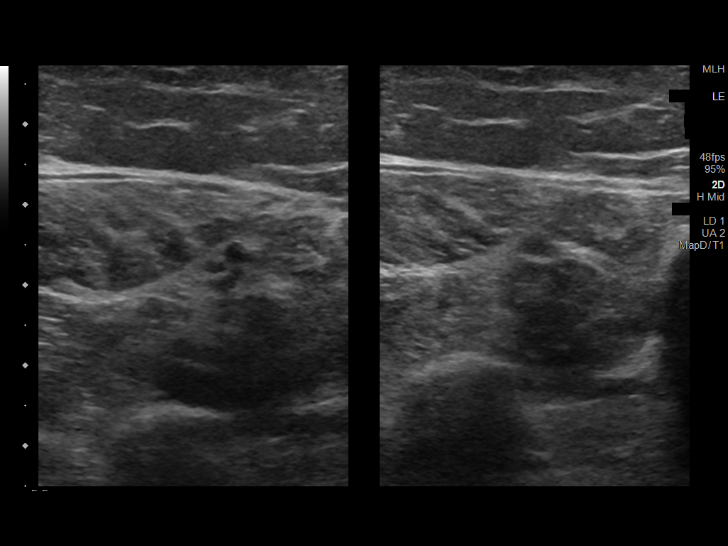
[im 33/37]
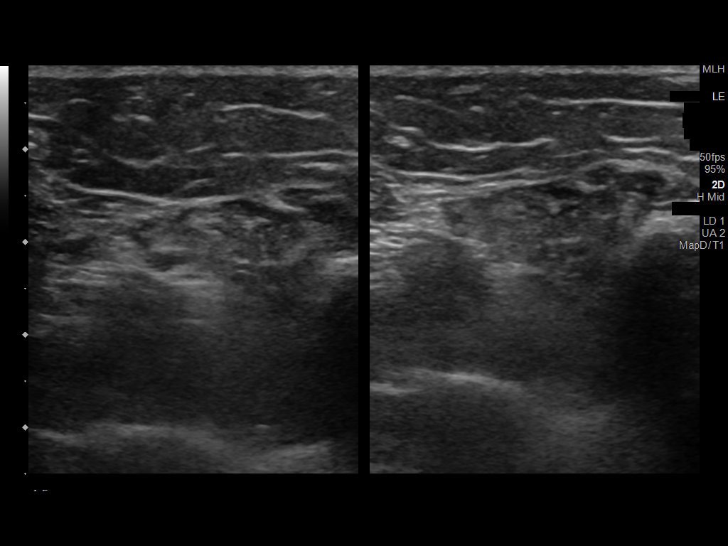
[im 37/37]
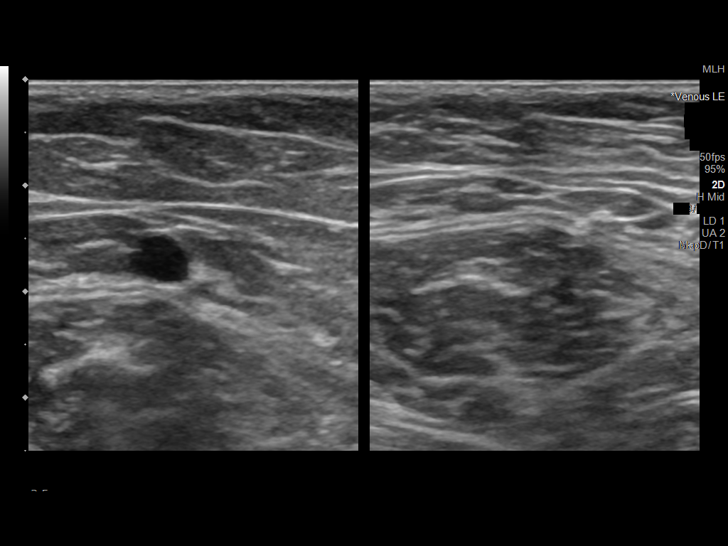

[13 of 24 positions shown; findings below may reference images not displayed]

FINDINGS: Contralateral Common Femoral Vein: Respiratory phasicity is normal
and symmetric with the symptomatic side. No evidence of thrombus.
Normal compressibility.

Common Femoral Vein: No evidence of thrombus. Normal
compressibility, respiratory phasicity and response to augmentation.

Saphenofemoral Junction: No evidence of thrombus. Normal
compressibility and flow on color Doppler imaging.

Profunda Femoral Vein: No evidence of thrombus. Normal
compressibility and flow on color Doppler imaging.

Femoral Vein: No evidence of thrombus. Normal compressibility,
respiratory phasicity and response to augmentation.

Popliteal Vein: No evidence of thrombus. Normal compressibility,
respiratory phasicity and response to augmentation.

Calf Veins: No evidence of thrombus. Normal compressibility and flow
on color Doppler imaging.

Superficial Great Saphenous Vein: No evidence of thrombus. Normal
compressibility.

Venous Reflux:  None.

Other Findings: Note is made of a prominent though non
pathologically enlarged left inguinal lymph node which is not
enlarged by size criteria measuring 0.7 cm in greatest short axis
diameter and maintains a benign fatty hilum, presumably reactive in
etiology.
IMPRESSION: No evidence of DVT within the left lower extremity.

## 2021-10-19 IMAGING — CT CT ABD-PELV W/ CM
2 of 5 series · 16 of 46 positions shown, 18 images · IV contrast (APPLIED)
Comparison: None.

CLINICAL DATA: Abdominal pain, back and hip pain.

EXAM:
CT ABDOMEN AND PELVIS WITH CONTRAST
TECHNIQUE: Multidetector CT imaging of the abdomen and pelvis was performed
using the standard protocol following bolus administration of
intravenous contrast.
CONTRAST:  100mL OMNIPAQUE IOHEXOL 300 MG/ML  SOLN

[Series 3: abdomen 5.0 · axial · 0.67mm/px · z∈[-393,-13]mm · 13 of 90 slices shown, 15 images]
[im 7/90  soft-tissue]
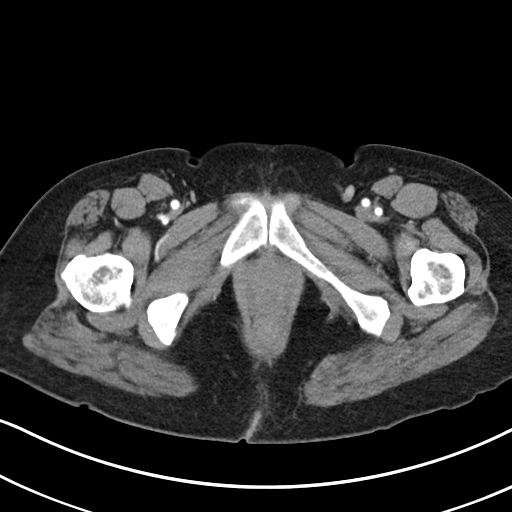
[im 7/90  bone]
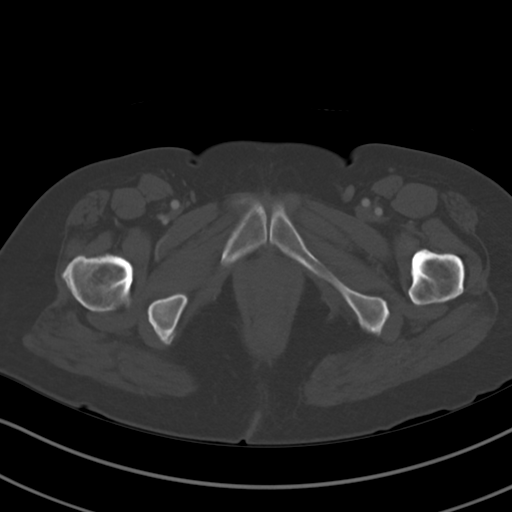
[im 13/90  soft-tissue]
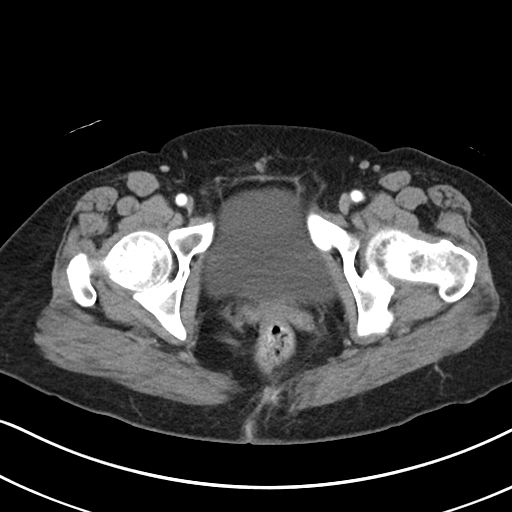
[im 20/90  soft-tissue]
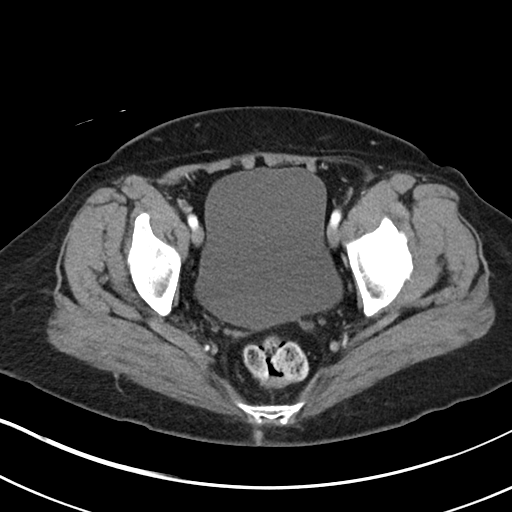
[im 26/90  soft-tissue]
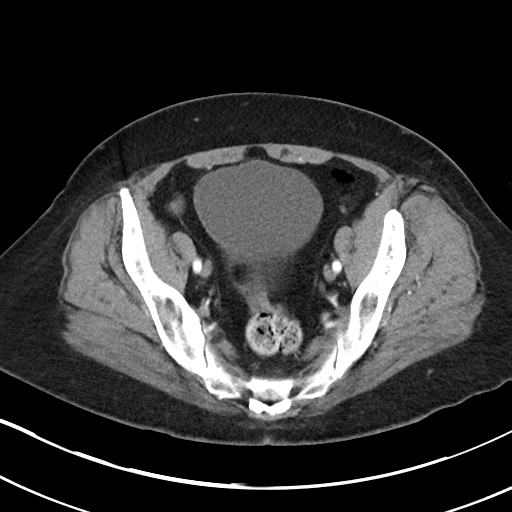
[im 32/90  soft-tissue]
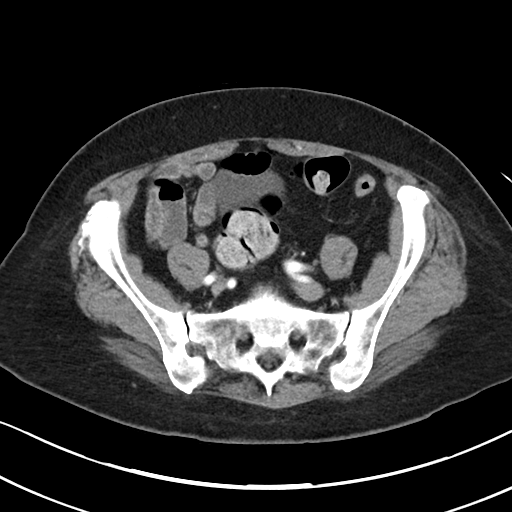
[im 39/90  soft-tissue]
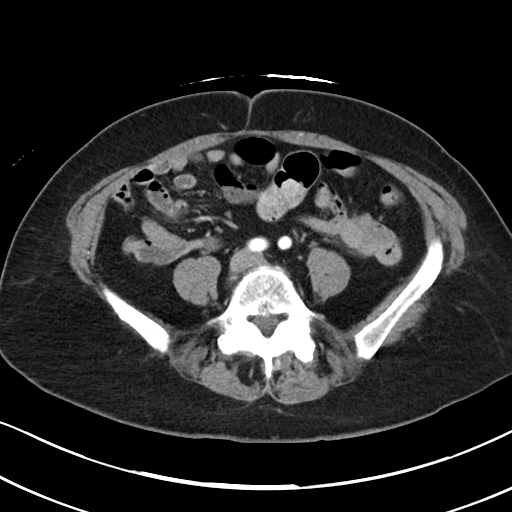
[im 45/90  soft-tissue]
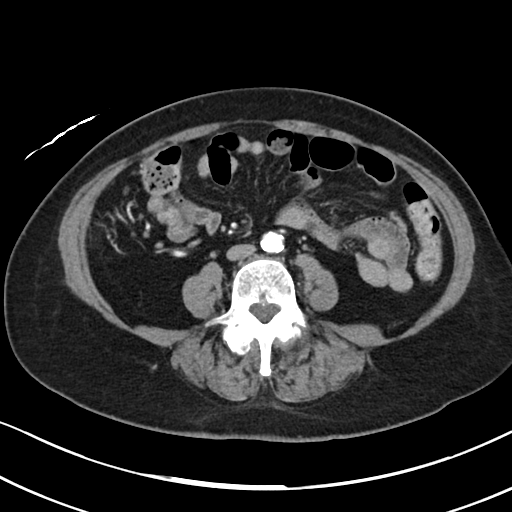
[im 51/90  soft-tissue]
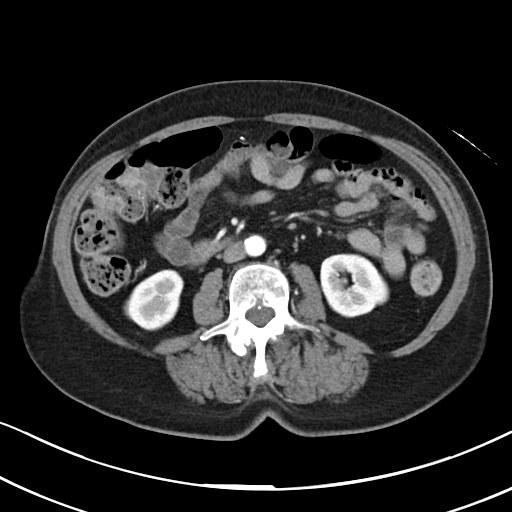
[im 58/90  soft-tissue]
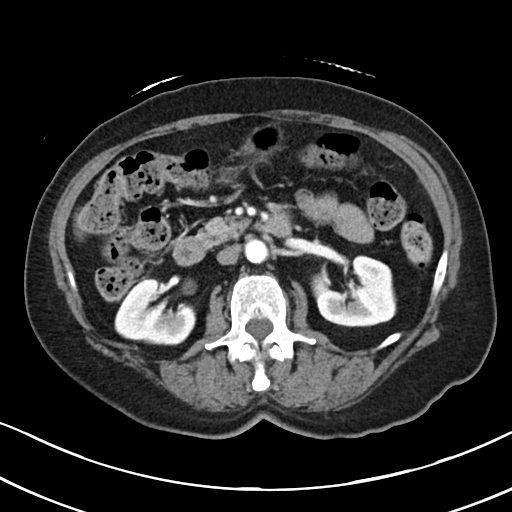
[im 58/90  bone]
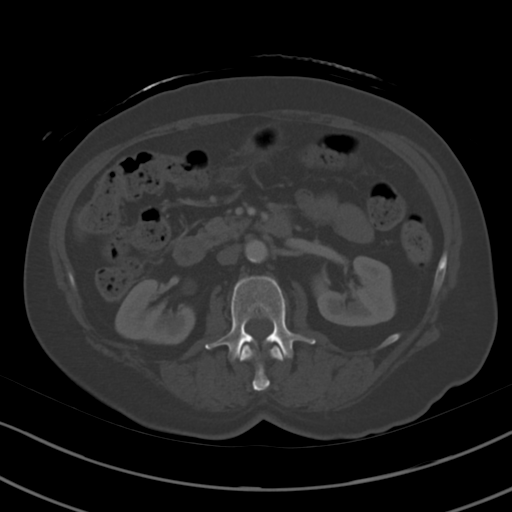
[im 64/90  soft-tissue]
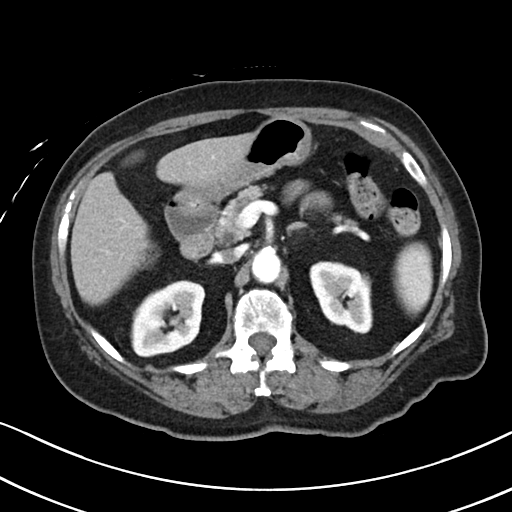
[im 70/90  soft-tissue]
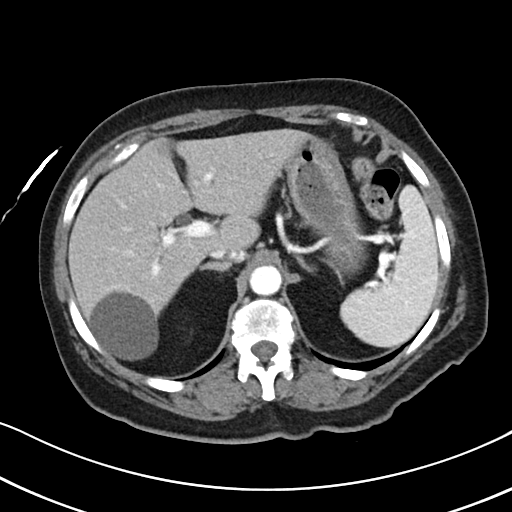
[im 77/90  soft-tissue]
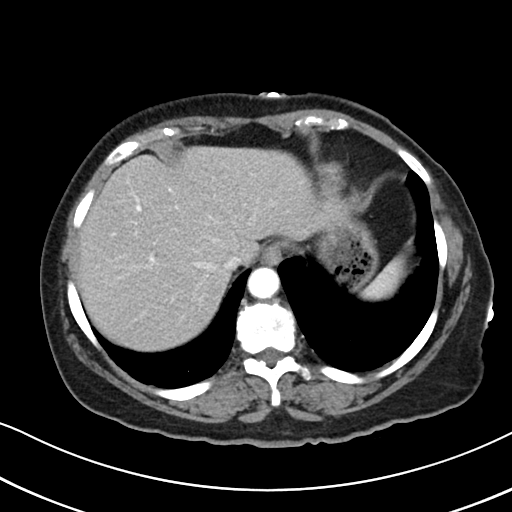
[im 83/90  soft-tissue]
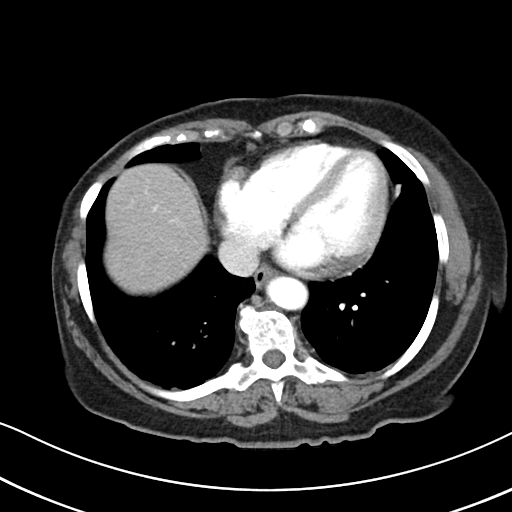

[Series 6: abdomen 3.0 mpr cor · coronal · 0.61mm/px · 3 of 85 slices shown]
[im 29/85  soft-tissue]
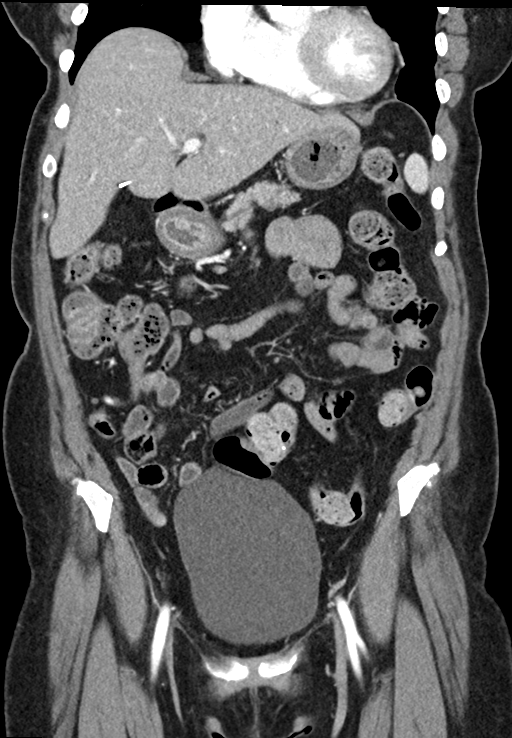
[im 38/85  soft-tissue]
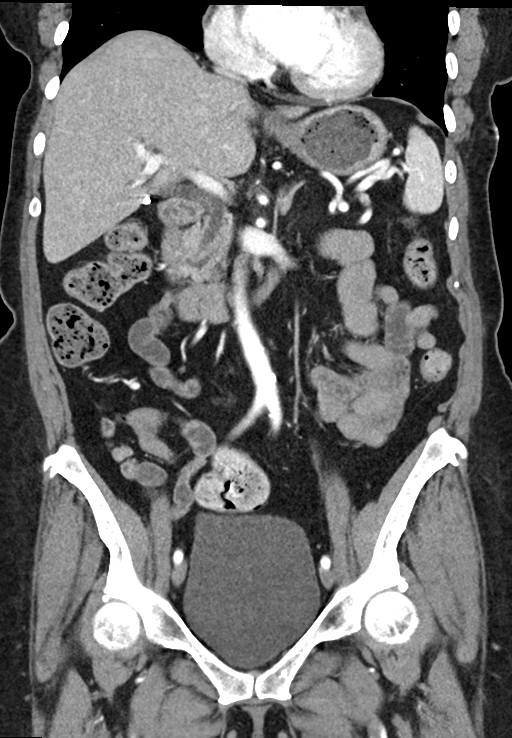
[im 47/85  soft-tissue]
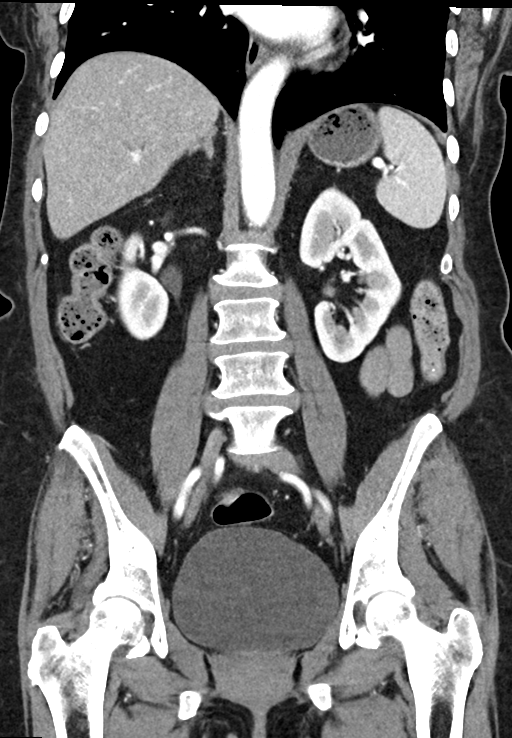

[16 of 46 positions shown; findings below may reference images not displayed]

FINDINGS: Lower chest: Scarring in the lung bases.  No acute abnormality.

Hepatobiliary: Prior cholecystectomy. Mild diffuse fatty
infiltration of the liver. 5 cm simple appearing cyst posteriorly in
the right hepatic lobe is stable since prior study. No biliary
ductal dilatation.

Pancreas: No focal abnormality or ductal dilatation.

Spleen: No focal abnormality.  Normal size.

Adrenals/Urinary Tract: No adrenal abnormality. No focal renal
abnormality. No stones or hydronephrosis. Urinary bladder is
unremarkable.

Stomach/Bowel: Stomach, large and small bowel grossly unremarkable.
Moderate stool burden in the colon.

Vascular/Lymphatic: Aortic atherosclerosis. No enlarged abdominal or
pelvic lymph nodes.

Reproductive: Prior hysterectomy.  No adnexal masses.

Other: No free fluid or free air.

Musculoskeletal: No acute bony abnormality.
IMPRESSION: Mild fatty infiltration of the liver.

Moderate stool burden in the colon.

Aortic atherosclerosis.

No acute findings in the abdomen or pelvis.

## 2021-11-10 DIAGNOSIS — M25562 Pain in left knee: Secondary | ICD-10-CM | POA: Diagnosis not present

## 2021-12-11 DIAGNOSIS — H524 Presbyopia: Secondary | ICD-10-CM | POA: Diagnosis not present

## 2022-02-25 DIAGNOSIS — Z23 Encounter for immunization: Secondary | ICD-10-CM | POA: Diagnosis not present

## 2022-03-22 DIAGNOSIS — M6281 Muscle weakness (generalized): Secondary | ICD-10-CM | POA: Diagnosis not present

## 2022-03-30 DIAGNOSIS — R296 Repeated falls: Secondary | ICD-10-CM | POA: Diagnosis not present

## 2022-03-30 DIAGNOSIS — R29898 Other symptoms and signs involving the musculoskeletal system: Secondary | ICD-10-CM | POA: Diagnosis not present

## 2022-03-30 DIAGNOSIS — M79605 Pain in left leg: Secondary | ICD-10-CM | POA: Diagnosis not present

## 2022-04-05 DIAGNOSIS — M79605 Pain in left leg: Secondary | ICD-10-CM | POA: Diagnosis not present

## 2022-04-05 DIAGNOSIS — R296 Repeated falls: Secondary | ICD-10-CM | POA: Diagnosis not present

## 2022-04-05 DIAGNOSIS — R29898 Other symptoms and signs involving the musculoskeletal system: Secondary | ICD-10-CM | POA: Diagnosis not present

## 2022-04-07 DIAGNOSIS — R29898 Other symptoms and signs involving the musculoskeletal system: Secondary | ICD-10-CM | POA: Diagnosis not present

## 2022-04-07 DIAGNOSIS — R296 Repeated falls: Secondary | ICD-10-CM | POA: Diagnosis not present

## 2022-04-07 DIAGNOSIS — M79605 Pain in left leg: Secondary | ICD-10-CM | POA: Diagnosis not present

## 2022-04-09 DIAGNOSIS — R296 Repeated falls: Secondary | ICD-10-CM | POA: Diagnosis not present

## 2022-04-09 DIAGNOSIS — R29898 Other symptoms and signs involving the musculoskeletal system: Secondary | ICD-10-CM | POA: Diagnosis not present

## 2022-04-09 DIAGNOSIS — M79605 Pain in left leg: Secondary | ICD-10-CM | POA: Diagnosis not present

## 2022-04-12 DIAGNOSIS — R29898 Other symptoms and signs involving the musculoskeletal system: Secondary | ICD-10-CM | POA: Diagnosis not present

## 2022-04-12 DIAGNOSIS — R296 Repeated falls: Secondary | ICD-10-CM | POA: Diagnosis not present

## 2022-04-12 DIAGNOSIS — M79605 Pain in left leg: Secondary | ICD-10-CM | POA: Diagnosis not present

## 2022-04-14 DIAGNOSIS — M79605 Pain in left leg: Secondary | ICD-10-CM | POA: Diagnosis not present

## 2022-04-14 DIAGNOSIS — R29898 Other symptoms and signs involving the musculoskeletal system: Secondary | ICD-10-CM | POA: Diagnosis not present

## 2022-04-14 DIAGNOSIS — R296 Repeated falls: Secondary | ICD-10-CM | POA: Diagnosis not present

## 2022-04-19 DIAGNOSIS — R29898 Other symptoms and signs involving the musculoskeletal system: Secondary | ICD-10-CM | POA: Diagnosis not present

## 2022-04-19 DIAGNOSIS — M79605 Pain in left leg: Secondary | ICD-10-CM | POA: Diagnosis not present

## 2022-04-19 DIAGNOSIS — R296 Repeated falls: Secondary | ICD-10-CM | POA: Diagnosis not present

## 2022-04-21 DIAGNOSIS — M79605 Pain in left leg: Secondary | ICD-10-CM | POA: Diagnosis not present

## 2022-04-21 DIAGNOSIS — R296 Repeated falls: Secondary | ICD-10-CM | POA: Diagnosis not present

## 2022-04-21 DIAGNOSIS — R29898 Other symptoms and signs involving the musculoskeletal system: Secondary | ICD-10-CM | POA: Diagnosis not present

## 2022-04-23 DIAGNOSIS — M79605 Pain in left leg: Secondary | ICD-10-CM | POA: Diagnosis not present

## 2022-04-23 DIAGNOSIS — R296 Repeated falls: Secondary | ICD-10-CM | POA: Diagnosis not present

## 2022-04-23 DIAGNOSIS — R29898 Other symptoms and signs involving the musculoskeletal system: Secondary | ICD-10-CM | POA: Diagnosis not present

## 2022-04-29 DIAGNOSIS — F339 Major depressive disorder, recurrent, unspecified: Secondary | ICD-10-CM | POA: Diagnosis not present

## 2022-04-29 DIAGNOSIS — E7800 Pure hypercholesterolemia, unspecified: Secondary | ICD-10-CM | POA: Diagnosis not present

## 2022-04-29 DIAGNOSIS — R7303 Prediabetes: Secondary | ICD-10-CM | POA: Diagnosis not present

## 2022-04-29 DIAGNOSIS — Z13820 Encounter for screening for osteoporosis: Secondary | ICD-10-CM | POA: Diagnosis not present

## 2022-04-29 DIAGNOSIS — I10 Essential (primary) hypertension: Secondary | ICD-10-CM | POA: Diagnosis not present

## 2022-04-29 DIAGNOSIS — H029 Unspecified disorder of eyelid: Secondary | ICD-10-CM | POA: Diagnosis not present

## 2022-04-29 DIAGNOSIS — Z0000 Encounter for general adult medical examination without abnormal findings: Secondary | ICD-10-CM | POA: Diagnosis not present

## 2022-04-29 DIAGNOSIS — R2689 Other abnormalities of gait and mobility: Secondary | ICD-10-CM | POA: Diagnosis not present

## 2022-04-29 DIAGNOSIS — R296 Repeated falls: Secondary | ICD-10-CM | POA: Diagnosis not present

## 2022-04-29 DIAGNOSIS — M79605 Pain in left leg: Secondary | ICD-10-CM | POA: Diagnosis not present

## 2022-04-29 DIAGNOSIS — R29898 Other symptoms and signs involving the musculoskeletal system: Secondary | ICD-10-CM | POA: Diagnosis not present

## 2022-04-29 DIAGNOSIS — D509 Iron deficiency anemia, unspecified: Secondary | ICD-10-CM | POA: Diagnosis not present

## 2022-05-03 DIAGNOSIS — M79605 Pain in left leg: Secondary | ICD-10-CM | POA: Diagnosis not present

## 2022-05-03 DIAGNOSIS — R296 Repeated falls: Secondary | ICD-10-CM | POA: Diagnosis not present

## 2022-05-03 DIAGNOSIS — R29898 Other symptoms and signs involving the musculoskeletal system: Secondary | ICD-10-CM | POA: Diagnosis not present

## 2022-05-05 DIAGNOSIS — R296 Repeated falls: Secondary | ICD-10-CM | POA: Diagnosis not present

## 2022-05-05 DIAGNOSIS — R29898 Other symptoms and signs involving the musculoskeletal system: Secondary | ICD-10-CM | POA: Diagnosis not present

## 2022-05-05 DIAGNOSIS — M79605 Pain in left leg: Secondary | ICD-10-CM | POA: Diagnosis not present

## 2022-05-10 DIAGNOSIS — R296 Repeated falls: Secondary | ICD-10-CM | POA: Diagnosis not present

## 2022-05-10 DIAGNOSIS — M79605 Pain in left leg: Secondary | ICD-10-CM | POA: Diagnosis not present

## 2022-05-10 DIAGNOSIS — R29898 Other symptoms and signs involving the musculoskeletal system: Secondary | ICD-10-CM | POA: Diagnosis not present

## 2022-05-12 DIAGNOSIS — Z780 Asymptomatic menopausal state: Secondary | ICD-10-CM | POA: Diagnosis not present

## 2022-05-19 DIAGNOSIS — R29898 Other symptoms and signs involving the musculoskeletal system: Secondary | ICD-10-CM | POA: Diagnosis not present

## 2022-05-19 DIAGNOSIS — R296 Repeated falls: Secondary | ICD-10-CM | POA: Diagnosis not present

## 2022-05-19 DIAGNOSIS — M79605 Pain in left leg: Secondary | ICD-10-CM | POA: Diagnosis not present

## 2022-05-25 DIAGNOSIS — M79605 Pain in left leg: Secondary | ICD-10-CM | POA: Diagnosis not present

## 2022-05-25 DIAGNOSIS — R29898 Other symptoms and signs involving the musculoskeletal system: Secondary | ICD-10-CM | POA: Diagnosis not present

## 2022-05-25 DIAGNOSIS — R296 Repeated falls: Secondary | ICD-10-CM | POA: Diagnosis not present

## 2022-05-27 DIAGNOSIS — R29898 Other symptoms and signs involving the musculoskeletal system: Secondary | ICD-10-CM | POA: Diagnosis not present

## 2022-05-27 DIAGNOSIS — M79605 Pain in left leg: Secondary | ICD-10-CM | POA: Diagnosis not present

## 2022-05-27 DIAGNOSIS — R296 Repeated falls: Secondary | ICD-10-CM | POA: Diagnosis not present

## 2022-06-16 DIAGNOSIS — E663 Overweight: Secondary | ICD-10-CM | POA: Diagnosis not present

## 2022-06-16 DIAGNOSIS — F339 Major depressive disorder, recurrent, unspecified: Secondary | ICD-10-CM | POA: Diagnosis not present

## 2022-06-23 DIAGNOSIS — Z1231 Encounter for screening mammogram for malignant neoplasm of breast: Secondary | ICD-10-CM | POA: Diagnosis not present

## 2022-07-29 DIAGNOSIS — H02821 Cysts of right upper eyelid: Secondary | ICD-10-CM | POA: Diagnosis not present

## 2022-07-29 DIAGNOSIS — L820 Inflamed seborrheic keratosis: Secondary | ICD-10-CM | POA: Diagnosis not present

## 2022-07-29 DIAGNOSIS — D492 Neoplasm of unspecified behavior of bone, soft tissue, and skin: Secondary | ICD-10-CM | POA: Diagnosis not present

## 2022-09-17 ENCOUNTER — Other Ambulatory Visit: Payer: Self-pay | Admitting: Family Medicine

## 2022-09-17 DIAGNOSIS — Z9989 Dependence on other enabling machines and devices: Secondary | ICD-10-CM | POA: Diagnosis not present

## 2022-09-17 DIAGNOSIS — M47816 Spondylosis without myelopathy or radiculopathy, lumbar region: Secondary | ICD-10-CM

## 2022-09-17 DIAGNOSIS — M545 Low back pain, unspecified: Secondary | ICD-10-CM | POA: Diagnosis not present

## 2022-09-21 ENCOUNTER — Ambulatory Visit (HOSPITAL_BASED_OUTPATIENT_CLINIC_OR_DEPARTMENT_OTHER)
Admission: RE | Admit: 2022-09-21 | Discharge: 2022-09-21 | Disposition: A | Discharge status: Home / Self Care | Payer: Medicare HMO | Source: Ambulatory Visit | Attending: Family Medicine | Admitting: Family Medicine

## 2022-09-21 DIAGNOSIS — M47816 Spondylosis without myelopathy or radiculopathy, lumbar region: Secondary | ICD-10-CM

## 2022-09-21 DIAGNOSIS — M4316 Spondylolisthesis, lumbar region: Secondary | ICD-10-CM | POA: Diagnosis not present

## 2022-09-21 DIAGNOSIS — M545 Low back pain, unspecified: Secondary | ICD-10-CM | POA: Diagnosis not present

## 2022-10-07 DIAGNOSIS — M4316 Spondylolisthesis, lumbar region: Secondary | ICD-10-CM | POA: Diagnosis not present

## 2022-10-07 DIAGNOSIS — M47816 Spondylosis without myelopathy or radiculopathy, lumbar region: Secondary | ICD-10-CM | POA: Diagnosis not present

## 2022-10-08 ENCOUNTER — Other Ambulatory Visit: Payer: Self-pay | Admitting: Student in an Organized Health Care Education/Training Program

## 2022-10-08 DIAGNOSIS — M4316 Spondylolisthesis, lumbar region: Secondary | ICD-10-CM

## 2022-10-08 DIAGNOSIS — M47816 Spondylosis without myelopathy or radiculopathy, lumbar region: Secondary | ICD-10-CM

## 2022-10-30 ENCOUNTER — Other Ambulatory Visit: Payer: Medicare HMO

## 2022-11-02 DIAGNOSIS — I10 Essential (primary) hypertension: Secondary | ICD-10-CM | POA: Diagnosis not present

## 2022-11-02 DIAGNOSIS — Z0000 Encounter for general adult medical examination without abnormal findings: Secondary | ICD-10-CM | POA: Diagnosis not present

## 2022-11-07 DIAGNOSIS — M48061 Spinal stenosis, lumbar region without neurogenic claudication: Secondary | ICD-10-CM | POA: Diagnosis not present

## 2022-11-07 DIAGNOSIS — M4316 Spondylolisthesis, lumbar region: Secondary | ICD-10-CM | POA: Diagnosis not present

## 2022-11-07 DIAGNOSIS — M47816 Spondylosis without myelopathy or radiculopathy, lumbar region: Secondary | ICD-10-CM | POA: Diagnosis not present

## 2022-11-07 DIAGNOSIS — M5126 Other intervertebral disc displacement, lumbar region: Secondary | ICD-10-CM | POA: Diagnosis not present

## 2022-11-11 DIAGNOSIS — M5432 Sciatica, left side: Secondary | ICD-10-CM | POA: Diagnosis not present

## 2022-11-11 DIAGNOSIS — M4316 Spondylolisthesis, lumbar region: Secondary | ICD-10-CM | POA: Diagnosis not present

## 2022-11-11 DIAGNOSIS — M48062 Spinal stenosis, lumbar region with neurogenic claudication: Secondary | ICD-10-CM | POA: Diagnosis not present

## 2022-11-11 DIAGNOSIS — M5431 Sciatica, right side: Secondary | ICD-10-CM | POA: Diagnosis not present

## 2022-11-16 ENCOUNTER — Other Ambulatory Visit: Payer: Medicare HMO

## 2022-11-26 DIAGNOSIS — M48062 Spinal stenosis, lumbar region with neurogenic claudication: Secondary | ICD-10-CM | POA: Diagnosis not present

## 2022-11-26 DIAGNOSIS — M4316 Spondylolisthesis, lumbar region: Secondary | ICD-10-CM | POA: Diagnosis not present

## 2022-11-26 DIAGNOSIS — M5432 Sciatica, left side: Secondary | ICD-10-CM | POA: Diagnosis not present

## 2022-11-26 DIAGNOSIS — M5431 Sciatica, right side: Secondary | ICD-10-CM | POA: Diagnosis not present

## 2022-11-29 DIAGNOSIS — Z01810 Encounter for preprocedural cardiovascular examination: Secondary | ICD-10-CM | POA: Diagnosis not present

## 2022-11-30 DIAGNOSIS — H5213 Myopia, bilateral: Secondary | ICD-10-CM | POA: Diagnosis not present

## 2022-12-01 DIAGNOSIS — I10 Essential (primary) hypertension: Secondary | ICD-10-CM | POA: Diagnosis not present

## 2022-12-01 DIAGNOSIS — M48062 Spinal stenosis, lumbar region with neurogenic claudication: Secondary | ICD-10-CM | POA: Diagnosis not present

## 2022-12-01 DIAGNOSIS — M5431 Sciatica, right side: Secondary | ICD-10-CM | POA: Diagnosis not present

## 2022-12-01 DIAGNOSIS — Z981 Arthrodesis status: Secondary | ICD-10-CM | POA: Diagnosis not present

## 2022-12-01 DIAGNOSIS — Z79899 Other long term (current) drug therapy: Secondary | ICD-10-CM | POA: Diagnosis not present

## 2022-12-01 DIAGNOSIS — M5432 Sciatica, left side: Secondary | ICD-10-CM | POA: Diagnosis not present

## 2022-12-01 DIAGNOSIS — Z0189 Encounter for other specified special examinations: Secondary | ICD-10-CM | POA: Diagnosis not present

## 2022-12-01 DIAGNOSIS — M4316 Spondylolisthesis, lumbar region: Secondary | ICD-10-CM | POA: Diagnosis not present

## 2022-12-02 DIAGNOSIS — M4316 Spondylolisthesis, lumbar region: Secondary | ICD-10-CM | POA: Diagnosis not present

## 2022-12-02 DIAGNOSIS — Z981 Arthrodesis status: Secondary | ICD-10-CM | POA: Diagnosis not present

## 2022-12-02 DIAGNOSIS — M47816 Spondylosis without myelopathy or radiculopathy, lumbar region: Secondary | ICD-10-CM | POA: Diagnosis not present

## 2022-12-02 DIAGNOSIS — I10 Essential (primary) hypertension: Secondary | ICD-10-CM | POA: Diagnosis not present

## 2022-12-02 DIAGNOSIS — Z79899 Other long term (current) drug therapy: Secondary | ICD-10-CM | POA: Diagnosis not present

## 2022-12-02 DIAGNOSIS — M48062 Spinal stenosis, lumbar region with neurogenic claudication: Secondary | ICD-10-CM | POA: Diagnosis not present

## 2022-12-02 DIAGNOSIS — Z9889 Other specified postprocedural states: Secondary | ICD-10-CM | POA: Diagnosis not present

## 2022-12-07 DIAGNOSIS — M48062 Spinal stenosis, lumbar region with neurogenic claudication: Secondary | ICD-10-CM | POA: Diagnosis not present

## 2022-12-07 DIAGNOSIS — M4316 Spondylolisthesis, lumbar region: Secondary | ICD-10-CM | POA: Diagnosis not present

## 2022-12-07 DIAGNOSIS — H9190 Unspecified hearing loss, unspecified ear: Secondary | ICD-10-CM | POA: Diagnosis not present

## 2022-12-07 DIAGNOSIS — Z981 Arthrodesis status: Secondary | ICD-10-CM | POA: Diagnosis not present

## 2022-12-07 DIAGNOSIS — Z4789 Encounter for other orthopedic aftercare: Secondary | ICD-10-CM | POA: Diagnosis not present

## 2022-12-07 DIAGNOSIS — I10 Essential (primary) hypertension: Secondary | ICD-10-CM | POA: Diagnosis not present

## 2022-12-07 DIAGNOSIS — Z7982 Long term (current) use of aspirin: Secondary | ICD-10-CM | POA: Diagnosis not present

## 2022-12-07 DIAGNOSIS — M47816 Spondylosis without myelopathy or radiculopathy, lumbar region: Secondary | ICD-10-CM | POA: Diagnosis not present

## 2022-12-07 DIAGNOSIS — Z79891 Long term (current) use of opiate analgesic: Secondary | ICD-10-CM | POA: Diagnosis not present

## 2022-12-08 DIAGNOSIS — Z7982 Long term (current) use of aspirin: Secondary | ICD-10-CM | POA: Diagnosis not present

## 2022-12-08 DIAGNOSIS — Z981 Arthrodesis status: Secondary | ICD-10-CM | POA: Diagnosis not present

## 2022-12-08 DIAGNOSIS — Z4789 Encounter for other orthopedic aftercare: Secondary | ICD-10-CM | POA: Diagnosis not present

## 2022-12-08 DIAGNOSIS — H9190 Unspecified hearing loss, unspecified ear: Secondary | ICD-10-CM | POA: Diagnosis not present

## 2022-12-08 DIAGNOSIS — M4316 Spondylolisthesis, lumbar region: Secondary | ICD-10-CM | POA: Diagnosis not present

## 2022-12-08 DIAGNOSIS — Z79891 Long term (current) use of opiate analgesic: Secondary | ICD-10-CM | POA: Diagnosis not present

## 2022-12-08 DIAGNOSIS — M48062 Spinal stenosis, lumbar region with neurogenic claudication: Secondary | ICD-10-CM | POA: Diagnosis not present

## 2022-12-08 DIAGNOSIS — I10 Essential (primary) hypertension: Secondary | ICD-10-CM | POA: Diagnosis not present

## 2022-12-08 DIAGNOSIS — M47816 Spondylosis without myelopathy or radiculopathy, lumbar region: Secondary | ICD-10-CM | POA: Diagnosis not present

## 2022-12-09 DIAGNOSIS — Z981 Arthrodesis status: Secondary | ICD-10-CM | POA: Diagnosis not present

## 2022-12-09 DIAGNOSIS — Z7982 Long term (current) use of aspirin: Secondary | ICD-10-CM | POA: Diagnosis not present

## 2022-12-09 DIAGNOSIS — I10 Essential (primary) hypertension: Secondary | ICD-10-CM | POA: Diagnosis not present

## 2022-12-09 DIAGNOSIS — Z4789 Encounter for other orthopedic aftercare: Secondary | ICD-10-CM | POA: Diagnosis not present

## 2022-12-09 DIAGNOSIS — M48062 Spinal stenosis, lumbar region with neurogenic claudication: Secondary | ICD-10-CM | POA: Diagnosis not present

## 2022-12-09 DIAGNOSIS — Z79891 Long term (current) use of opiate analgesic: Secondary | ICD-10-CM | POA: Diagnosis not present

## 2022-12-09 DIAGNOSIS — H9190 Unspecified hearing loss, unspecified ear: Secondary | ICD-10-CM | POA: Diagnosis not present

## 2022-12-09 DIAGNOSIS — M4316 Spondylolisthesis, lumbar region: Secondary | ICD-10-CM | POA: Diagnosis not present

## 2022-12-09 DIAGNOSIS — M47816 Spondylosis without myelopathy or radiculopathy, lumbar region: Secondary | ICD-10-CM | POA: Diagnosis not present

## 2022-12-14 DIAGNOSIS — H9190 Unspecified hearing loss, unspecified ear: Secondary | ICD-10-CM | POA: Diagnosis not present

## 2022-12-14 DIAGNOSIS — Z981 Arthrodesis status: Secondary | ICD-10-CM | POA: Diagnosis not present

## 2022-12-14 DIAGNOSIS — I10 Essential (primary) hypertension: Secondary | ICD-10-CM | POA: Diagnosis not present

## 2022-12-14 DIAGNOSIS — M4316 Spondylolisthesis, lumbar region: Secondary | ICD-10-CM | POA: Diagnosis not present

## 2022-12-14 DIAGNOSIS — Z79891 Long term (current) use of opiate analgesic: Secondary | ICD-10-CM | POA: Diagnosis not present

## 2022-12-14 DIAGNOSIS — Z4789 Encounter for other orthopedic aftercare: Secondary | ICD-10-CM | POA: Diagnosis not present

## 2022-12-14 DIAGNOSIS — Z7982 Long term (current) use of aspirin: Secondary | ICD-10-CM | POA: Diagnosis not present

## 2022-12-14 DIAGNOSIS — M48062 Spinal stenosis, lumbar region with neurogenic claudication: Secondary | ICD-10-CM | POA: Diagnosis not present

## 2022-12-14 DIAGNOSIS — M47816 Spondylosis without myelopathy or radiculopathy, lumbar region: Secondary | ICD-10-CM | POA: Diagnosis not present

## 2022-12-15 DIAGNOSIS — Z7982 Long term (current) use of aspirin: Secondary | ICD-10-CM | POA: Diagnosis not present

## 2022-12-15 DIAGNOSIS — M47816 Spondylosis without myelopathy or radiculopathy, lumbar region: Secondary | ICD-10-CM | POA: Diagnosis not present

## 2022-12-15 DIAGNOSIS — M48062 Spinal stenosis, lumbar region with neurogenic claudication: Secondary | ICD-10-CM | POA: Diagnosis not present

## 2022-12-15 DIAGNOSIS — Z4789 Encounter for other orthopedic aftercare: Secondary | ICD-10-CM | POA: Diagnosis not present

## 2022-12-15 DIAGNOSIS — M4316 Spondylolisthesis, lumbar region: Secondary | ICD-10-CM | POA: Diagnosis not present

## 2022-12-15 DIAGNOSIS — I10 Essential (primary) hypertension: Secondary | ICD-10-CM | POA: Diagnosis not present

## 2022-12-15 DIAGNOSIS — H9190 Unspecified hearing loss, unspecified ear: Secondary | ICD-10-CM | POA: Diagnosis not present

## 2022-12-15 DIAGNOSIS — Z981 Arthrodesis status: Secondary | ICD-10-CM | POA: Diagnosis not present

## 2022-12-15 DIAGNOSIS — Z79891 Long term (current) use of opiate analgesic: Secondary | ICD-10-CM | POA: Diagnosis not present

## 2022-12-16 DIAGNOSIS — I10 Essential (primary) hypertension: Secondary | ICD-10-CM | POA: Diagnosis not present

## 2022-12-16 DIAGNOSIS — M4316 Spondylolisthesis, lumbar region: Secondary | ICD-10-CM | POA: Diagnosis not present

## 2022-12-16 DIAGNOSIS — Z4789 Encounter for other orthopedic aftercare: Secondary | ICD-10-CM | POA: Diagnosis not present

## 2022-12-16 DIAGNOSIS — M48062 Spinal stenosis, lumbar region with neurogenic claudication: Secondary | ICD-10-CM | POA: Diagnosis not present

## 2022-12-16 DIAGNOSIS — M47816 Spondylosis without myelopathy or radiculopathy, lumbar region: Secondary | ICD-10-CM | POA: Diagnosis not present

## 2022-12-16 DIAGNOSIS — Z981 Arthrodesis status: Secondary | ICD-10-CM | POA: Diagnosis not present

## 2022-12-16 DIAGNOSIS — H9190 Unspecified hearing loss, unspecified ear: Secondary | ICD-10-CM | POA: Diagnosis not present

## 2022-12-16 DIAGNOSIS — Z7982 Long term (current) use of aspirin: Secondary | ICD-10-CM | POA: Diagnosis not present

## 2022-12-16 DIAGNOSIS — Z79891 Long term (current) use of opiate analgesic: Secondary | ICD-10-CM | POA: Diagnosis not present

## 2022-12-20 DIAGNOSIS — Z7982 Long term (current) use of aspirin: Secondary | ICD-10-CM | POA: Diagnosis not present

## 2022-12-20 DIAGNOSIS — I10 Essential (primary) hypertension: Secondary | ICD-10-CM | POA: Diagnosis not present

## 2022-12-20 DIAGNOSIS — Z4789 Encounter for other orthopedic aftercare: Secondary | ICD-10-CM | POA: Diagnosis not present

## 2022-12-20 DIAGNOSIS — Z981 Arthrodesis status: Secondary | ICD-10-CM | POA: Diagnosis not present

## 2022-12-20 DIAGNOSIS — M48062 Spinal stenosis, lumbar region with neurogenic claudication: Secondary | ICD-10-CM | POA: Diagnosis not present

## 2022-12-20 DIAGNOSIS — M47816 Spondylosis without myelopathy or radiculopathy, lumbar region: Secondary | ICD-10-CM | POA: Diagnosis not present

## 2022-12-20 DIAGNOSIS — H9190 Unspecified hearing loss, unspecified ear: Secondary | ICD-10-CM | POA: Diagnosis not present

## 2022-12-20 DIAGNOSIS — M4316 Spondylolisthesis, lumbar region: Secondary | ICD-10-CM | POA: Diagnosis not present

## 2022-12-20 DIAGNOSIS — Z79891 Long term (current) use of opiate analgesic: Secondary | ICD-10-CM | POA: Diagnosis not present

## 2022-12-21 DIAGNOSIS — Z4789 Encounter for other orthopedic aftercare: Secondary | ICD-10-CM | POA: Diagnosis not present

## 2022-12-21 DIAGNOSIS — Z981 Arthrodesis status: Secondary | ICD-10-CM | POA: Diagnosis not present

## 2022-12-21 DIAGNOSIS — Z7982 Long term (current) use of aspirin: Secondary | ICD-10-CM | POA: Diagnosis not present

## 2022-12-21 DIAGNOSIS — H9190 Unspecified hearing loss, unspecified ear: Secondary | ICD-10-CM | POA: Diagnosis not present

## 2022-12-21 DIAGNOSIS — M48062 Spinal stenosis, lumbar region with neurogenic claudication: Secondary | ICD-10-CM | POA: Diagnosis not present

## 2022-12-21 DIAGNOSIS — I10 Essential (primary) hypertension: Secondary | ICD-10-CM | POA: Diagnosis not present

## 2022-12-21 DIAGNOSIS — M47816 Spondylosis without myelopathy or radiculopathy, lumbar region: Secondary | ICD-10-CM | POA: Diagnosis not present

## 2022-12-21 DIAGNOSIS — Z79891 Long term (current) use of opiate analgesic: Secondary | ICD-10-CM | POA: Diagnosis not present

## 2022-12-21 DIAGNOSIS — M4316 Spondylolisthesis, lumbar region: Secondary | ICD-10-CM | POA: Diagnosis not present

## 2022-12-23 DIAGNOSIS — M48062 Spinal stenosis, lumbar region with neurogenic claudication: Secondary | ICD-10-CM | POA: Diagnosis not present

## 2022-12-23 DIAGNOSIS — H9190 Unspecified hearing loss, unspecified ear: Secondary | ICD-10-CM | POA: Diagnosis not present

## 2022-12-23 DIAGNOSIS — M4316 Spondylolisthesis, lumbar region: Secondary | ICD-10-CM | POA: Diagnosis not present

## 2022-12-23 DIAGNOSIS — I10 Essential (primary) hypertension: Secondary | ICD-10-CM | POA: Diagnosis not present

## 2022-12-23 DIAGNOSIS — Z79891 Long term (current) use of opiate analgesic: Secondary | ICD-10-CM | POA: Diagnosis not present

## 2022-12-23 DIAGNOSIS — Z981 Arthrodesis status: Secondary | ICD-10-CM | POA: Diagnosis not present

## 2022-12-23 DIAGNOSIS — M47816 Spondylosis without myelopathy or radiculopathy, lumbar region: Secondary | ICD-10-CM | POA: Diagnosis not present

## 2022-12-23 DIAGNOSIS — Z4789 Encounter for other orthopedic aftercare: Secondary | ICD-10-CM | POA: Diagnosis not present

## 2022-12-23 DIAGNOSIS — Z7982 Long term (current) use of aspirin: Secondary | ICD-10-CM | POA: Diagnosis not present

## 2022-12-28 DIAGNOSIS — M47816 Spondylosis without myelopathy or radiculopathy, lumbar region: Secondary | ICD-10-CM | POA: Diagnosis not present

## 2022-12-28 DIAGNOSIS — M4316 Spondylolisthesis, lumbar region: Secondary | ICD-10-CM | POA: Diagnosis not present

## 2022-12-28 DIAGNOSIS — Z79891 Long term (current) use of opiate analgesic: Secondary | ICD-10-CM | POA: Diagnosis not present

## 2022-12-28 DIAGNOSIS — I10 Essential (primary) hypertension: Secondary | ICD-10-CM | POA: Diagnosis not present

## 2022-12-28 DIAGNOSIS — Z7982 Long term (current) use of aspirin: Secondary | ICD-10-CM | POA: Diagnosis not present

## 2022-12-28 DIAGNOSIS — M48062 Spinal stenosis, lumbar region with neurogenic claudication: Secondary | ICD-10-CM | POA: Diagnosis not present

## 2022-12-28 DIAGNOSIS — Z981 Arthrodesis status: Secondary | ICD-10-CM | POA: Diagnosis not present

## 2022-12-28 DIAGNOSIS — Z4789 Encounter for other orthopedic aftercare: Secondary | ICD-10-CM | POA: Diagnosis not present

## 2022-12-28 DIAGNOSIS — H9190 Unspecified hearing loss, unspecified ear: Secondary | ICD-10-CM | POA: Diagnosis not present

## 2023-01-07 DIAGNOSIS — M4316 Spondylolisthesis, lumbar region: Secondary | ICD-10-CM | POA: Diagnosis not present

## 2023-02-10 DIAGNOSIS — M1711 Unilateral primary osteoarthritis, right knee: Secondary | ICD-10-CM | POA: Diagnosis not present

## 2023-02-10 DIAGNOSIS — M25561 Pain in right knee: Secondary | ICD-10-CM | POA: Diagnosis not present

## 2023-02-19 DIAGNOSIS — H524 Presbyopia: Secondary | ICD-10-CM | POA: Diagnosis not present

## 2023-02-21 DIAGNOSIS — M1711 Unilateral primary osteoarthritis, right knee: Secondary | ICD-10-CM | POA: Diagnosis not present

## 2023-03-01 DIAGNOSIS — M1711 Unilateral primary osteoarthritis, right knee: Secondary | ICD-10-CM | POA: Diagnosis not present

## 2023-03-08 DIAGNOSIS — M1711 Unilateral primary osteoarthritis, right knee: Secondary | ICD-10-CM | POA: Diagnosis not present

## 2023-03-11 DIAGNOSIS — M4316 Spondylolisthesis, lumbar region: Secondary | ICD-10-CM | POA: Diagnosis not present

## 2023-03-11 DIAGNOSIS — M791 Myalgia, unspecified site: Secondary | ICD-10-CM | POA: Diagnosis not present

## 2023-03-11 DIAGNOSIS — M48062 Spinal stenosis, lumbar region with neurogenic claudication: Secondary | ICD-10-CM | POA: Diagnosis not present

## 2023-03-18 DIAGNOSIS — M1711 Unilateral primary osteoarthritis, right knee: Secondary | ICD-10-CM | POA: Diagnosis not present

## 2023-05-02 DIAGNOSIS — M25561 Pain in right knee: Secondary | ICD-10-CM | POA: Diagnosis not present

## 2023-05-04 DIAGNOSIS — Z13820 Encounter for screening for osteoporosis: Secondary | ICD-10-CM | POA: Diagnosis not present

## 2023-05-04 DIAGNOSIS — F339 Major depressive disorder, recurrent, unspecified: Secondary | ICD-10-CM | POA: Diagnosis not present

## 2023-05-04 DIAGNOSIS — I10 Essential (primary) hypertension: Secondary | ICD-10-CM | POA: Diagnosis not present

## 2023-05-04 DIAGNOSIS — R7303 Prediabetes: Secondary | ICD-10-CM | POA: Diagnosis not present

## 2023-05-04 DIAGNOSIS — D509 Iron deficiency anemia, unspecified: Secondary | ICD-10-CM | POA: Diagnosis not present

## 2023-05-04 DIAGNOSIS — Z0000 Encounter for general adult medical examination without abnormal findings: Secondary | ICD-10-CM | POA: Diagnosis not present

## 2023-05-04 DIAGNOSIS — R2689 Other abnormalities of gait and mobility: Secondary | ICD-10-CM | POA: Diagnosis not present

## 2023-05-04 DIAGNOSIS — E7800 Pure hypercholesterolemia, unspecified: Secondary | ICD-10-CM | POA: Diagnosis not present

## 2023-05-04 DIAGNOSIS — G479 Sleep disorder, unspecified: Secondary | ICD-10-CM | POA: Diagnosis not present

## 2023-06-29 DIAGNOSIS — Z1231 Encounter for screening mammogram for malignant neoplasm of breast: Secondary | ICD-10-CM | POA: Diagnosis not present

## 2023-07-01 DIAGNOSIS — M5432 Sciatica, left side: Secondary | ICD-10-CM | POA: Diagnosis not present

## 2023-07-01 DIAGNOSIS — M4326 Fusion of spine, lumbar region: Secondary | ICD-10-CM | POA: Diagnosis not present

## 2023-07-01 DIAGNOSIS — M4316 Spondylolisthesis, lumbar region: Secondary | ICD-10-CM | POA: Diagnosis not present

## 2023-07-01 DIAGNOSIS — M5431 Sciatica, right side: Secondary | ICD-10-CM | POA: Diagnosis not present

## 2023-12-23 DIAGNOSIS — B351 Tinea unguium: Secondary | ICD-10-CM | POA: Diagnosis not present

## 2023-12-23 DIAGNOSIS — M79674 Pain in right toe(s): Secondary | ICD-10-CM | POA: Diagnosis not present

## 2023-12-26 DIAGNOSIS — I10 Essential (primary) hypertension: Secondary | ICD-10-CM | POA: Diagnosis not present

## 2023-12-26 DIAGNOSIS — E7800 Pure hypercholesterolemia, unspecified: Secondary | ICD-10-CM | POA: Diagnosis not present

## 2023-12-26 DIAGNOSIS — Z0000 Encounter for general adult medical examination without abnormal findings: Secondary | ICD-10-CM | POA: Diagnosis not present

## 2023-12-26 DIAGNOSIS — M6281 Muscle weakness (generalized): Secondary | ICD-10-CM | POA: Diagnosis not present

## 2023-12-26 DIAGNOSIS — R29898 Other symptoms and signs involving the musculoskeletal system: Secondary | ICD-10-CM | POA: Diagnosis not present

## 2023-12-26 DIAGNOSIS — R739 Hyperglycemia, unspecified: Secondary | ICD-10-CM | POA: Diagnosis not present

## 2024-02-10 DIAGNOSIS — R0989 Other specified symptoms and signs involving the circulatory and respiratory systems: Secondary | ICD-10-CM | POA: Diagnosis not present

## 2024-02-23 ENCOUNTER — Other Ambulatory Visit: Payer: Self-pay | Admitting: *Deleted

## 2024-02-23 DIAGNOSIS — M79606 Pain in leg, unspecified: Secondary | ICD-10-CM

## 2024-03-14 ENCOUNTER — Ambulatory Visit: Payer: Self-pay | Admitting: Vascular Surgery

## 2024-03-14 ENCOUNTER — Ambulatory Visit (HOSPITAL_COMMUNITY)
Admission: RE | Admit: 2024-03-14 | Discharge: 2024-03-14 | Disposition: A | Discharge status: Home / Self Care | Payer: Self-pay | Source: Ambulatory Visit | Attending: Vascular Surgery | Admitting: Vascular Surgery

## 2024-03-14 ENCOUNTER — Encounter: Payer: Self-pay | Admitting: Vascular Surgery

## 2024-03-14 VITALS — BP 127/83 | HR 74 | Temp 98.1°F | Wt 148.0 lb

## 2024-03-14 DIAGNOSIS — G609 Hereditary and idiopathic neuropathy, unspecified: Secondary | ICD-10-CM | POA: Insufficient documentation

## 2024-03-14 DIAGNOSIS — M79606 Pain in leg, unspecified: Secondary | ICD-10-CM | POA: Insufficient documentation

## 2024-03-14 LAB — VAS US ABI WITH/WO TBI
Left ABI: 1.09
Right ABI: 1.07

## 2024-03-14 NOTE — Progress Notes (Signed)
 Patient ID: Audrey Sanchez, female   DOB: 10/21/1945, 78 y.o.   MRN: [id number]  Reason for Consult: No chief complaint on file.   Referred by Gwenn Norris, MD  Subjective:     HPI:  Audrey Sanchez is a 78 y.o. female history of hyperlipidemia and hypertension as well as previous history of left lower extremity neuropathy.  She states that the neuropathy which was burning in her left hip and thigh has resolved but that she remains with imbalance.  She does not have any tissue loss or ulceration.  States that she had a TIA over 30 years ago but no history of stroke and no personal or family history of aneurysm disease.  She currently walks with the help of a cane secondary to imbalance.  Past Medical History:  Diagnosis Date   Anxiety    Depression    Diverticulitis    hx of   Hyperlipidemia    Hypertension    Thyroid  nodule    Family History  Problem Relation Age of Onset   Throat cancer Mother    Diabetes Mother    Hypertension Mother    Cirrhosis Father    Alcoholism Brother    Stroke Brother    Heart attack Brother    Leukemia Maternal Grandmother    Stroke Maternal Grandfather    Past Surgical History:  Procedure Laterality Date   ABDOMINAL HYSTERECTOMY  1996   BUNIONECTOMY  1995   CATARACT EXTRACTION  11/2013   CHOLECYSTECTOMY     tonsillectomy     child    Short Social History:  Social History   Tobacco Use   Smoking status: Never   Smokeless tobacco: Never  Substance Use Topics   Alcohol use: No    No Known Allergies  Current Outpatient Medications  Medication Sig Dispense Refill   aspirin EC 81 MG tablet Take 81 mg by mouth daily.     gabapentin  (NEURONTIN ) 100 MG capsule Take 1 capsule (100 mg total) by mouth 3 (three) times daily. Increase to 2 capsules 3 times a day after 1 week. 60 capsule 0   sertraline (ZOLOFT) 100 MG tablet 100 mg daily.     simvastatin (ZOCOR) 40 MG tablet Take 40 mg by mouth at bedtime.     traZODone (DESYREL) 50  MG tablet Take by mouth daily. 1- 1.5 tabs daily     valsartan-hydrochlorothiazide (DIOVAN-HCT) 320-25 MG tablet Take 1 tablet by mouth daily.     No current facility-administered medications for this visit.    Review of Systems  Constitutional:  Constitutional negative. HENT: HENT negative.  Eyes: Eyes negative.  Respiratory: Respiratory negative.  Cardiovascular: Cardiovascular negative.  GI: Gastrointestinal negative.  Musculoskeletal: Musculoskeletal negative.  Skin: Skin negative.  Neurological:       Imbalance Hematologic: Hematologic/lymphatic negative.  Psychiatric: Psychiatric negative.        Objective:  Objective  Vitals:   03/14/24 1321  BP: 127/83  Pulse: 74  Temp: 98.1 F (36.7 C)  SpO2: 94%    Physical Exam HENT:     Head: Normocephalic.     Nose: Nose normal.     Mouth/Throat:     Mouth: Mucous membranes are moist.  Cardiovascular:     Rate and Rhythm: Normal rate.     Pulses: Normal pulses.  Pulmonary:     Effort: Pulmonary effort is normal.  Abdominal:     General: Abdomen is flat.     Palpations: Abdomen is soft.  Musculoskeletal:        General: Normal range of motion.     Cervical back: Normal range of motion.     Right lower leg: No edema.     Left lower leg: No edema.  Skin:    General: Skin is warm.     Capillary Refill: Capillary refill takes less than 2 seconds.  Neurological:     General: No focal deficit present.     Mental Status: She is alert.  Psychiatric:        Mood and Affect: Mood normal.        Behavior: Behavior normal.     Data: ABI Findings:  +---------+------------------+-----+---------+--------+  Right   Rt Pressure (mmHg)IndexWaveform Comment   +---------+------------------+-----+---------+--------+  Brachial 115                                       +---------+------------------+-----+---------+--------+  PTA     128               1.04 triphasic           +---------+------------------+-----+---------+--------+  DP      131               1.07 triphasic          +---------+------------------+-----+---------+--------+  Great Toe91                0.74                    +---------+------------------+-----+---------+--------+   +---------+------------------+-----+---------+-------+  Left    Lt Pressure (mmHg)IndexWaveform Comment  +---------+------------------+-----+---------+-------+  Brachial 123                                      +---------+------------------+-----+---------+-------+  PTA     134               1.09 triphasic         +---------+------------------+-----+---------+-------+  DP      122               0.99 triphasic         +---------+------------------+-----+---------+-------+  Great Toe73                0.59                   +---------+------------------+-----+---------+-------+   +-------+-----------+-----------+------------+------------+  ABI/TBIToday's ABIToday's TBIPrevious ABIPrevious TBI  +-------+-----------+-----------+------------+------------+  Right 1.07       0.74                                 +-------+-----------+-----------+------------+------------+  Left  1.09       0.59                                 +-------+-----------+-----------+------------+------------+         Summary:  Right: Resting right ankle-brachial index is within normal range. The  right toe-brachial index is normal.    Left: Resting left ankle-brachial index is within normal range.  Left toe pressure is >60 mmHg which suggests adequate perfusion for  healing.      Assessment/Plan:     78 year old female sent for evaluation of  lower extremity arterial disease with bounding palpable 2+ pedal pulses and normal ABIs.  Imbalance unlikely related to vascular disease.  She can see me on an as-needed basis.     Penne Lonni Colorado MD [facility]

## 2024-03-15 ENCOUNTER — Encounter (HOSPITAL_COMMUNITY): Payer: Self-pay

## 2024-06-05 ENCOUNTER — Encounter: Payer: Self-pay | Admitting: Diagnostic Neuroimaging

## 2024-06-05 ENCOUNTER — Ambulatory Visit: Admitting: Diagnostic Neuroimaging

## 2024-06-05 VITALS — BP 129/85 | HR 78 | Ht 60.0 in | Wt 149.0 lb

## 2024-06-05 DIAGNOSIS — M48062 Spinal stenosis, lumbar region with neurogenic claudication: Secondary | ICD-10-CM | POA: Diagnosis not present

## 2024-06-05 NOTE — Patient Instructions (Addendum)
" °  Lumbar spinal stenosis with neurogenic claudication (severe spinal stenosis at L4-5 s/p surgery in July 2024) - back pain has improved; unfortunately still has intermittent leg weakness with standing and walking 3-5 minutes; follow up with spine surgeon for repeat imaging and evaluation if surgery would be considered; otherwise continue PT exercises "

## 2024-06-05 NOTE — Progress Notes (Signed)
 "  [facility]  PATIENT: Audrey Sanchez DOB: [birth year]  REFERRING CLINICIAN: Lennice Mliss KATHEE, PA HISTORY FROM: patient  REASON FOR VISIT: new consult   HISTORICAL  CHIEF COMPLAINT:  Chief Complaint  Patient presents with   RM 7     Patient is here for Bilateral leg weakness -  has been having this issues for months and is getting worse. Patient uses a cane     HISTORY OF PRESENT ILLNESS:   79 year old female here for evaluation of lower extremity weakness.  Symptoms started around 2021 with low back pain rating to the lower extremities, diagnosed with severe lumbar spinal stenosis at L4-5.  This was managed conservatively.  In July 2024 she underwent lumbar decompression surgery.  This helped improve low back pain.  However she still has intolerance of standing and walking more than 3 to 5 minutes at a time.  After this amount of standing she tends to lose strength in her legs and almost falls down.  She did try some home and clinic based PT but this did not help.   REVIEW OF SYSTEMS: Full 14 system review of systems performed and negative with exception of: as per HPI.  ALLERGIES: Allergies[1]  HOME MEDICATIONS: Outpatient Medications Prior to Visit  Medication Sig Dispense Refill   amLODipine (NORVASC) 5 MG tablet Take 5 mg by mouth daily.     aspirin EC 81 MG tablet Take 81 mg by mouth daily.     sertraline (ZOLOFT) 100 MG tablet 100 mg daily.     simvastatin (ZOCOR) 40 MG tablet Take 40 mg by mouth at bedtime.     traZODone (DESYREL) 50 MG tablet Take by mouth daily. 1- 1.5 tabs daily     valsartan-hydrochlorothiazide (DIOVAN-HCT) 320-25 MG tablet Take 1 tablet by mouth daily.     gabapentin  (NEURONTIN ) 100 MG capsule Take 1 capsule (100 mg total) by mouth 3 (three) times daily. Increase to 2 capsules 3 times a day after 1 week. (Patient not taking: Reported on 06/05/2024) 60 capsule 0   No facility-administered medications prior to visit.    PAST  MEDICAL HISTORY: Past Medical History:  Diagnosis Date   Anxiety    Depression    Diverticulitis    hx of   Hyperlipidemia    Hypertension    Thyroid  nodule     PAST SURGICAL HISTORY: Past Surgical History:  Procedure Laterality Date   ABDOMINAL HYSTERECTOMY  1996   BUNIONECTOMY  1995   CATARACT EXTRACTION  11/2013   CHOLECYSTECTOMY     tonsillectomy     child    FAMILY HISTORY: Family History  Problem Relation Age of Onset   Throat cancer Mother    Diabetes Mother    Hypertension Mother    Cirrhosis Father    Migraines Sister    Alcoholism Brother    Stroke Brother    Heart attack Brother    Leukemia Maternal Grandmother    Stroke Maternal Grandfather    Sleep apnea Other    Stroke Other    Seizures Neg Hx     SOCIAL HISTORY: Social History   Socioeconomic History   Marital status: Single    Spouse name: Not on file   Number of children: 2   Years of education: 12   Highest education level: Not on file  Occupational History    Comment: CNA, retired  Tobacco Use   Smoking status: Never   Smokeless tobacco: Never  [employer]  Vaping status: Never Used  Substance and Sexual Activity   Alcohol use: No   Drug use: No   Sexual activity: Not on file  Other Topics Concern   Not on file  Social History Narrative   Lives alone and has disabled son with her   No caffeine - 1 cup of decaf coffee or tea    Social Drivers of Health   Tobacco Use: Low Risk (06/05/2024)   Patient History    Smoking Tobacco Use: Never    Smokeless Tobacco Use: Never    Passive Exposure: Not on file  Financial Resource Strain: Not on file  Food Insecurity: Low Risk (12/01/2022)   Received from [facility]   Epic    Within the past 12 months, you worried that your food would run out before you got money to buy more: Never true    Within the past 12 months, the food you bought just didn't last and you didn't have money to get more. : Never true  Transportation Needs: No  Transportation Needs (12/01/2022)   Received from [facility]    In the past 12 months, has lack of reliable transportation kept you from medical appointments, meetings, work or from getting things needed for daily living? : No  Physical Activity: Not on file  Stress: Not on file  Social Connections: Unknown (10/02/2021)   Received from [facility]   Social Network    Social Network: Not on file  Intimate Partner Violence: Unknown (08/25/2021)   Received from [facility]   HITS    Physically Hurt: Not on file    Insult or Talk Down To: Not on file    Threaten Physical Harm: Not on file    Scream or Curse: Not on file  Depression (EYV7-0): Not on file  Alcohol Screen: Not on file  Housing: Low Risk (12/01/2022)   Received from [facility]   Epic    What is your living situation today?: I have a steady place to live    Think about the place you live. Do you have problems with any of the following? Choose all that apply:: None/None on this list  Utilities: Low Risk (12/01/2022)   Received from [facility]   Utilities    In the past 12 months has the electric, gas, oil, or water company threatened to shut off services in your home? : No  Health Literacy: Not on file     PHYSICAL EXAM  GENERAL EXAM/CONSTITUTIONAL: Vitals:  Vitals:   06/05/24 1121  BP: 129/85  Pulse: 78  Weight: 149 lb (67.6 kg)  Height: 5' (1.524 m)   Body mass index is 29.1 kg/m. Wt Readings from Last 3 Encounters:  06/05/24 149 lb (67.6 kg)  03/14/24 148 lb (67.1 kg)  07/02/19 150 lb 9.6 oz (68.3 kg)   Patient is in no distress; well developed, nourished and groomed; neck is supple  CARDIOVASCULAR: Examination of carotid arteries is normal; no carotid bruits Regular rate and rhythm, no murmurs Examination of peripheral vascular system by observation and palpation is normal  EYES: Ophthalmoscopic exam of optic discs and posterior segments is normal; no papilledema or  hemorrhages No results found.  MUSCULOSKELETAL: Gait, strength, tone, movements noted in Neurologic exam below  NEUROLOGIC: MENTAL STATUS:      No data to display         awake, alert, oriented to person, place and time recent and remote memory intact normal attention and concentration  language fluent, comprehension intact, naming intact fund of knowledge appropriate  CRANIAL NERVE:  2nd - no papilledema on fundoscopic exam 2nd, 3rd, 4th, 6th - pupils equal and reactive to light, visual fields full to confrontation, extraocular muscles intact, no nystagmus 5th - facial sensation symmetric 7th - facial strength symmetric 8th - hearing intact 9th - palate elevates symmetrically, uvula midline 11th - shoulder shrug symmetric 12th - tongue protrusion midline  MOTOR:  normal bulk and tone, full strength in the BUE, BLE  SENSORY:  normal and symmetric to light touch, temperature, vibration  COORDINATION:  finger-nose-finger, fine finger movements normal  REFLEXES:  deep tendon reflexes TRACE and symmetric  GAIT/STATION:  narrow based gait; SLIGHTLY UNSTEADY GAIT; SLOW AND CAUTIOUS     DIAGNOSTIC DATA (LABS, IMAGING, TESTING) - I reviewed patient records, labs, notes, testing and imaging myself where available.  Lab Results  Component Value Date   WBC 6.5 05/07/2019   HGB 12.9 05/07/2019   HCT 38.5 05/07/2019   MCV 85.9 05/07/2019   PLT 342 05/07/2019      Component Value Date/Time   NA 131 (L) 05/07/2019 1128   K 4.7 05/07/2019 1128   CL 95 (L) 05/07/2019 1128   CO2 23 05/07/2019 1128   GLUCOSE 92 05/07/2019 1128   BUN 11 05/07/2019 1128   CREATININE 0.72 05/07/2019 1128   CALCIUM 9.6 05/07/2019 1128   PROT 7.2 05/07/2019 1128   ALBUMIN 4.1 05/07/2019 1128   AST 37 05/07/2019 1128   ALT 23 05/07/2019 1128   ALKPHOS 69 05/07/2019 1128   BILITOT 0.9 05/07/2019 1128   GFRNONAA >60 05/07/2019 1128   GFRAA >60 05/07/2019 1128   No results found for:  CHOL, HDL, LDLCALC, LDLDIRECT, TRIG, CHOLHDL No results found for: YHAJ8R No results found for: VITAMINB12 No results found for: TSH  04/03/20 EMG/NCS  This is an abnormal study.  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  Severe L4-L5 canal stenosis secondary to degenerative changes and L4 anterolisthesis with resultant crowding of the cauda equina nerve roots.  2.  Moderate L2-L3 and L3-L4 canal stenosis.  3.  Moderate-to-severe bilateral L2-L3, L3-L4, and L4-L5 foraminal stenosis.  4.  Additional degenerative changes described in detail above.    ASSESSMENT AND PLAN  79 y.o. year old female here with:  Dx:  1. Spinal stenosis of lumbar region with neurogenic claudication     PLAN:  Lumbar spinal stenosis with neurogenic claudication (severe spinal stenosis at L4-5 s/p surgery in July 2024) - back pain has improved; unfortunately still has intermittent leg weakness with standing and walking 3-5 minutes; follow up with spine surgeon for repeat imaging and evaluation if surgery would be considered; otherwise continue PT exercises at home  Return for return to PCP, pending if symptoms worsen or fail to improve.    EDUARD FABIENE HANLON, MD 06/05/2024, 12:00 PM Certified in Neurology, Neurophysiology and Neuroimaging  [facility] [address], [address] [place], [place] [address] [phone]     [1]  Allergies Allergen Reactions   Morphine Nausea Only and Other (See Comments)    N/V  morphine   "
